# Patient Record
Sex: Male | Born: 1980 | Race: White | Hispanic: No | Marital: Single | Smoking: Never smoker
Health system: Southern US, Community
[De-identification: ages and names within clinical notes are randomized; demographics above are authoritative.]

## PROBLEM LIST (undated history)

## (undated) DIAGNOSIS — F70 Mild intellectual disabilities: Secondary | ICD-10-CM

## (undated) DIAGNOSIS — G800 Spastic quadriplegic cerebral palsy: Secondary | ICD-10-CM

## (undated) DIAGNOSIS — R519 Headache, unspecified: Secondary | ICD-10-CM

## (undated) DIAGNOSIS — R569 Unspecified convulsions: Secondary | ICD-10-CM

## (undated) DIAGNOSIS — I10 Essential (primary) hypertension: Secondary | ICD-10-CM

## (undated) DIAGNOSIS — Z9689 Presence of other specified functional implants: Secondary | ICD-10-CM

## (undated) HISTORY — DX: Spastic quadriplegic cerebral palsy: G80.0

## (undated) HISTORY — DX: Essential (primary) hypertension: I10

## (undated) HISTORY — DX: Mild intellectual disabilities: F70

## (undated) HISTORY — DX: Presence of other specified functional implants: Z96.89

## (undated) HISTORY — DX: Unspecified convulsions: R56.9

## (undated) HISTORY — PX: OTHER SURGICAL HISTORY: SHX169

---

## 2010-03-05 HISTORY — PX: WISDOM TOOTH EXTRACTION: SHX21

## 2011-09-09 DIAGNOSIS — G808 Other cerebral palsy: Secondary | ICD-10-CM | POA: Diagnosis not present

## 2011-10-19 DIAGNOSIS — I10 Essential (primary) hypertension: Secondary | ICD-10-CM | POA: Diagnosis not present

## 2011-10-19 DIAGNOSIS — G40909 Epilepsy, unspecified, not intractable, without status epilepticus: Secondary | ICD-10-CM | POA: Diagnosis not present

## 2011-11-22 DIAGNOSIS — G40309 Generalized idiopathic epilepsy and epileptic syndromes, not intractable, without status epilepticus: Secondary | ICD-10-CM | POA: Diagnosis not present

## 2011-11-22 DIAGNOSIS — G808 Other cerebral palsy: Secondary | ICD-10-CM | POA: Diagnosis not present

## 2011-11-22 DIAGNOSIS — G40209 Localization-related (focal) (partial) symptomatic epilepsy and epileptic syndromes with complex partial seizures, not intractable, without status epilepticus: Secondary | ICD-10-CM | POA: Diagnosis not present

## 2011-11-22 DIAGNOSIS — G248 Other dystonia: Secondary | ICD-10-CM | POA: Diagnosis not present

## 2011-12-09 DIAGNOSIS — G808 Other cerebral palsy: Secondary | ICD-10-CM | POA: Diagnosis not present

## 2012-04-25 DIAGNOSIS — G40909 Epilepsy, unspecified, not intractable, without status epilepticus: Secondary | ICD-10-CM | POA: Diagnosis not present

## 2012-04-25 DIAGNOSIS — I10 Essential (primary) hypertension: Secondary | ICD-10-CM | POA: Diagnosis not present

## 2012-05-04 DIAGNOSIS — G808 Other cerebral palsy: Secondary | ICD-10-CM | POA: Diagnosis not present

## 2012-07-17 DIAGNOSIS — G40209 Localization-related (focal) (partial) symptomatic epilepsy and epileptic syndromes with complex partial seizures, not intractable, without status epilepticus: Secondary | ICD-10-CM | POA: Diagnosis not present

## 2012-07-17 DIAGNOSIS — G248 Other dystonia: Secondary | ICD-10-CM | POA: Diagnosis not present

## 2012-07-17 DIAGNOSIS — G40309 Generalized idiopathic epilepsy and epileptic syndromes, not intractable, without status epilepticus: Secondary | ICD-10-CM | POA: Diagnosis not present

## 2012-08-17 DIAGNOSIS — G808 Other cerebral palsy: Secondary | ICD-10-CM | POA: Diagnosis not present

## 2012-11-16 DIAGNOSIS — G40909 Epilepsy, unspecified, not intractable, without status epilepticus: Secondary | ICD-10-CM | POA: Diagnosis not present

## 2012-11-16 DIAGNOSIS — G808 Other cerebral palsy: Secondary | ICD-10-CM | POA: Diagnosis not present

## 2012-11-16 DIAGNOSIS — I10 Essential (primary) hypertension: Secondary | ICD-10-CM | POA: Diagnosis not present

## 2013-01-29 ENCOUNTER — Other Ambulatory Visit: Payer: Self-pay

## 2013-01-29 MED ORDER — LAMOTRIGINE 100 MG PO TABS: 200.0000 mg | ORAL_TABLET | Freq: Three times a day (TID) | ORAL | Status: DC

## 2013-01-29 MED ORDER — LEVETIRACETAM 1000 MG PO TABS: 1000.0000 mg | ORAL_TABLET | Freq: Three times a day (TID) | ORAL | Status: DC

## 2013-01-29 MED ORDER — LACOSAMIDE 100 MG PO TABS: 100.0000 mg | ORAL_TABLET | Freq: Two times a day (BID) | ORAL | Status: DC

## 2013-04-01 DIAGNOSIS — I10 Essential (primary) hypertension: Secondary | ICD-10-CM | POA: Diagnosis not present

## 2013-04-01 DIAGNOSIS — G808 Other cerebral palsy: Secondary | ICD-10-CM | POA: Diagnosis not present

## 2013-04-01 DIAGNOSIS — G40909 Epilepsy, unspecified, not intractable, without status epilepticus: Secondary | ICD-10-CM | POA: Diagnosis not present

## 2013-04-18 DIAGNOSIS — G808 Other cerebral palsy: Secondary | ICD-10-CM | POA: Diagnosis not present

## 2013-06-07 ENCOUNTER — Telehealth: Payer: Self-pay | Admitting: Nurse Practitioner

## 2013-06-07 NOTE — Telephone Encounter (Signed)
We have not gotten any requests for this patient.  A 6 month Rx was sent to to the pharmacy on 01/29/13.  I called the pharmacy, [facility] has a Rx on file for Vimpat with refills which will be transferred to [facility]  They will contact patient when meds are ready for pick up.

## 2013-07-18 ENCOUNTER — Encounter (INDEPENDENT_AMBULATORY_CARE_PROVIDER_SITE_OTHER): Payer: Self-pay

## 2013-07-18 ENCOUNTER — Ambulatory Visit (INDEPENDENT_AMBULATORY_CARE_PROVIDER_SITE_OTHER): Payer: Medicare Other | Admitting: Nurse Practitioner

## 2013-07-18 ENCOUNTER — Encounter: Payer: Self-pay | Admitting: Nurse Practitioner

## 2013-07-18 VITALS — BP 119/67 | HR 65 | Ht 70.0 in | Wt 165.0 lb

## 2013-07-18 DIAGNOSIS — G248 Other dystonia: Secondary | ICD-10-CM | POA: Diagnosis not present

## 2013-07-18 DIAGNOSIS — G40309 Generalized idiopathic epilepsy and epileptic syndromes, not intractable, without status epilepticus: Secondary | ICD-10-CM

## 2013-07-18 DIAGNOSIS — G808 Other cerebral palsy: Secondary | ICD-10-CM | POA: Diagnosis not present

## 2013-07-18 DIAGNOSIS — G249 Dystonia, unspecified: Secondary | ICD-10-CM | POA: Insufficient documentation

## 2013-07-18 DIAGNOSIS — G40209 Localization-related (focal) (partial) symptomatic epilepsy and epileptic syndromes with complex partial seizures, not intractable, without status epilepticus: Secondary | ICD-10-CM | POA: Diagnosis not present

## 2013-07-18 MED ORDER — VIMPAT 150 MG PO TABS: 150.0000 mg | ORAL_TABLET | Freq: Two times a day (BID) | ORAL | Status: DC

## 2013-07-18 MED ORDER — LEVETIRACETAM 500 MG PO TABS: 1500.0000 mg | ORAL_TABLET | Freq: Two times a day (BID) | ORAL | Status: DC

## 2013-07-18 MED ORDER — LAMOTRIGINE 100 MG PO TABS: 200.0000 mg | ORAL_TABLET | Freq: Three times a day (TID) | ORAL | Status: DC

## 2013-07-18 NOTE — Patient Instructions (Signed)
Increase Vimpat to 150 mg BID Keppra 500 mg 3 tablets twice daily brand Lamictal 100 mg (2) 3 times a day brand Followup yearly and.

## 2013-07-18 NOTE — Progress Notes (Signed)
[facility]  PATIENT: Karla Vines DOB: [birth year]   REASON FOR VISIT: Followup for seizure disorder    HISTORY OF PRESENT ILLNESS:Mr. Gastelum 32 year old male  with Cerebral Palsy, left dominant hemi-paresis, spasticity - of Dr Thad Ranger (whom he sees In January and April, every 3 months for Botox injection.),  He achieved better seizure control on a higher Keppra dose of 1000 mg tid po,  Vimpat at 100 mg bid and Lamictal at 100 mg 2 tabs tid po. ALL BRAND NAME. He sleeps well at night 23:00 to 8 AM, no naps in daytime. He does goes for weeks  with no seizures and then will have several seizures back-to-back which are brief, but the mother states overall his seizures are better.   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 Seizures began in 1995. He is accompanied today by his mother. She tells me that his seizures are so much better since being placed on additional Keppra; he may have one seizure several days in a week and then go weeks without any. Seizures continue to be brief. No change in the character of his seizures, he will occasionally have a rare headache afterwards.   He is basically wheelchair-bound. [place]. He gets around in a golf cart. He is able to utilize a walker in the home since receiving Botox to the left hand. Prior to that, he mostly crawls on the floor due to spastic dystonia of the left wrist. He has received several Botox injections to the left hand and is now able to hold his walker with both hands and drive his [place] cart [place]. The mother and pt are pleased with the response. There have been no visual problems, no behavioral problems. Appetite is good, he is sleeping well. He has been receiving Botox in [place] with Dr. Thad Ranger.      REVIEW OF SYSTEMS: Full 14 system review of systems performed and notable  only for:  Constitutional: N/A  Cardiovascular: N/A  Ear/Nose/Throat: N/A  Skin: N/A  Eyes: N/A  Respiratory: N/A  Gastroitestinal: N/A  Hematology/Lymphatic: N/A  Endocrine: N/A Musculoskeletal:N/A  Allergy/Immunology: N/A  Neurological: N/A Psychiatric: N/A   ALLERGIES: Not on File  HOME MEDICATIONS: Outpatient Prescriptions Prior to Visit  Medication Sig Dispense Refill  . Lacosamide (VIMPAT) 100 MG TABS Take 1 tablet (100 mg total) by mouth 2 (two) times daily. Brand Medically Necessary  180 tablet  1  . lamoTRIgine (LAMICTAL) 100 MG tablet Take 2 tablets (200 mg total) by mouth 3 (three) times daily. Brand Medically Necessary  540 tablet  1  . levETIRAcetam (KEPPRA) 1000 MG tablet Take 1 tablet (1,000 mg total) by mouth 3 (three) times daily. Brand Medically Necessary  180 tablet  1   No facility-administered medications prior to visit.    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 No pertinent past surgical history.  FAMILY HISTORY: Heart disease father.  SOCIAL HISTORY: History   Social History  . Marital Status: Single    Spouse Name: N/A    Number of Children: N/A  . Years of Education: N/A   Occupational History  . Not on file.   Social History Main Topics  . Smoking status: Never Smoker   . Smokeless tobacco: Never Used  . Alcohol Use: Not  on file  . Drug Use: No  . Sexual Activity: Not on file   Other Topics Concern  . Not on file   Social History Narrative  . No narrative on Lives with parents disabled     PHYSICAL EXAM  Filed Vitals:   07/18/13 1414  BP: 119/67  Pulse: 65  Height: 5\' 10"  (1.778 m)  Weight: 165 lb (74.844 kg)   Body mass index is 23.68 kg/(m^2).  Generalized: Well developed, in no acute distress  Head: normocephalic and atraumatic,. Oropharynx benign  Neck: Supple, no carotid bruits  Cardiac: Regular rate rhythm, no  murmur  Musculoskeletal: No deformity   Neurological examination   Mentation: Alert oriented to time, place, Mild MR. Follows all commands speech dysarthric  Cranial nerve II-XII: Pupils were equal round reactive to light , disconjugate eye movements noted, visual fields are full  Facial sensation and strength were normal. hearing was intact to finger rubbing bilaterally. Uvula tongue midline. head turning and shoulder shrug and were normal and symmetric.Tongue protrusion into cheek strength was normal. Decrease shoulder shrug on the left Motor: Tone increased in all 3 extremities with sparing of the right upper extremity. Good grip strength on the right opens  fingers of the left hand but  spastic dystonia noted with clawlike deformity. Good strength in the right upper extremity and mild weakness both lower extremities Sensory: Withdraws to pain Coordination: Impaired on the left  Reflexes: Left reflexes are brisker than right no clonus . Gait and Station: Not ambulated    DIAGNOSTIC DATA (LABS, IMAGING, TESTING) -None to review   ASSESSMENT AND PLAN  32 y.o. 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 RX given to mother per request Nilda Riggs, [facility], [facility], APRN  [facility] [address] [place], [place] [address] [phone]

## 2013-07-19 DIAGNOSIS — G808 Other cerebral palsy: Secondary | ICD-10-CM | POA: Diagnosis not present

## 2013-11-12 DIAGNOSIS — G808 Other cerebral palsy: Secondary | ICD-10-CM | POA: Diagnosis not present

## 2013-11-12 DIAGNOSIS — G40909 Epilepsy, unspecified, not intractable, without status epilepticus: Secondary | ICD-10-CM | POA: Diagnosis not present

## 2013-11-12 DIAGNOSIS — I10 Essential (primary) hypertension: Secondary | ICD-10-CM | POA: Diagnosis not present

## 2013-11-22 DIAGNOSIS — G808 Other cerebral palsy: Secondary | ICD-10-CM | POA: Diagnosis not present

## 2013-12-17 ENCOUNTER — Other Ambulatory Visit: Payer: Self-pay

## 2013-12-17 MED ORDER — VIMPAT 150 MG PO TABS: 150.0000 mg | ORAL_TABLET | Freq: Two times a day (BID) | ORAL | Status: DC

## 2013-12-17 NOTE — Telephone Encounter (Signed)
Rx signed and faxed.

## 2014-02-28 DIAGNOSIS — G808 Other cerebral palsy: Secondary | ICD-10-CM | POA: Diagnosis not present

## 2014-05-30 DIAGNOSIS — G808 Other cerebral palsy: Secondary | ICD-10-CM | POA: Diagnosis not present

## 2014-06-12 DIAGNOSIS — I10 Essential (primary) hypertension: Secondary | ICD-10-CM | POA: Diagnosis not present

## 2014-06-12 DIAGNOSIS — G801 Spastic diplegic cerebral palsy: Secondary | ICD-10-CM | POA: Diagnosis not present

## 2014-06-18 ENCOUNTER — Telehealth: Payer: Self-pay | Admitting: Neurology
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
## 2014-06-18 NOTE — Telephone Encounter (Signed)
Patient's mother is calling. She states patient has been having more seizures and needs to be seen earlier than appointment he has scheduled on 07-17-14. Please call.

## 2014-06-19 NOTE — Telephone Encounter (Signed)
Spoke with patient mother in regards to sooner appointment. Explained that Dr. Brett Fairy does not have anything as of today. We will put patient on waitlist and call if office visit becomes available.

## 2014-06-24 NOTE — Telephone Encounter (Signed)
May I see this patient on Thursday as last patiet of the day ?

## 2014-06-27 NOTE — Telephone Encounter (Signed)
I called and LMVM for mother that appt available for 07-01-14 at 1430.  Please return call.

## 2014-06-27 NOTE — Telephone Encounter (Signed)
Appt made and confirmed by mother.

## 2014-07-01 ENCOUNTER — Ambulatory Visit (INDEPENDENT_AMBULATORY_CARE_PROVIDER_SITE_OTHER): Payer: Medicare Other | Admitting: Neurology

## 2014-07-01 ENCOUNTER — Encounter: Payer: Self-pay | Admitting: Neurology

## 2014-07-01 VITALS — BP 110/73 | HR 62 | Temp 98.7°F | Resp 14 | Ht 70.0 in | Wt 168.0 lb

## 2014-07-01 DIAGNOSIS — G40209 Localization-related (focal) (partial) symptomatic epilepsy and epileptic syndromes with complex partial seizures, not intractable, without status epilepticus: Secondary | ICD-10-CM

## 2014-07-01 MED ORDER — LAMOTRIGINE 100 MG PO TABS: 200.0000 mg | ORAL_TABLET | Freq: Three times a day (TID) | ORAL | Status: DC

## 2014-07-01 MED ORDER — VIMPAT 150 MG PO TABS: 150.0000 mg | ORAL_TABLET | Freq: Two times a day (BID) | ORAL | Status: DC

## 2014-07-01 MED ORDER — LEVETIRACETAM 500 MG PO TABS: 2000.0000 mg | ORAL_TABLET | Freq: Two times a day (BID) | ORAL | Status: DC

## 2014-07-01 NOTE — Patient Instructions (Signed)
I increased the Keppra to 200 mg bid po. If Harry Carr becomes increasingly irritable or depressed, please inform me and we  will reduce the dose back to 1500 mg bid.  Rv in 4-5 weeks with [name], NP .
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
## 2014-07-16 ENCOUNTER — Ambulatory Visit: Payer: Medicare Other | Admitting: Neurology

## 2014-07-17 ENCOUNTER — Ambulatory Visit: Payer: Medicare Other | Admitting: Neurology

## 2014-09-12 DIAGNOSIS — G808 Other cerebral palsy: Secondary | ICD-10-CM | POA: Diagnosis not present

## 2014-09-15 ENCOUNTER — Ambulatory Visit: Payer: Medicare Other | Admitting: Nurse Practitioner

## 2014-12-12 DIAGNOSIS — G808 Other cerebral palsy: Secondary | ICD-10-CM | POA: Diagnosis not present

## 2014-12-29 DIAGNOSIS — G40909 Epilepsy, unspecified, not intractable, without status epilepticus: Secondary | ICD-10-CM | POA: Diagnosis not present

## 2014-12-29 DIAGNOSIS — G801 Spastic diplegic cerebral palsy: Secondary | ICD-10-CM | POA: Diagnosis not present

## 2014-12-29 DIAGNOSIS — I10 Essential (primary) hypertension: Secondary | ICD-10-CM | POA: Diagnosis not present

## 2014-12-29 DIAGNOSIS — Z6830 Body mass index (BMI) 30.0-30.9, adult: Secondary | ICD-10-CM | POA: Diagnosis not present

## 2014-12-31 ENCOUNTER — Telehealth: Payer: Self-pay

## 2014-12-31 ENCOUNTER — Other Ambulatory Visit: Payer: Self-pay

## 2014-12-31 MED ORDER — VIMPAT 150 MG PO TABS: 150.0000 mg | ORAL_TABLET | Freq: Two times a day (BID) | ORAL | Status: DC

## 2014-12-31 NOTE — Telephone Encounter (Signed)
Will Fax to [place].

## 2014-12-31 NOTE — Telephone Encounter (Signed)
Called pt to alert him that rx is ready to be picked up, no answer, did not leave a message. Will try again later.

## 2014-12-31 NOTE — Telephone Encounter (Signed)
Rx signed and faxed.

## 2015-02-19 ENCOUNTER — Telehealth: Payer: Self-pay

## 2015-02-19 ENCOUNTER — Encounter: Payer: Self-pay | Admitting: Nurse Practitioner

## 2015-02-19 ENCOUNTER — Ambulatory Visit (INDEPENDENT_AMBULATORY_CARE_PROVIDER_SITE_OTHER): Payer: Medicare Other | Admitting: Nurse Practitioner

## 2015-02-19 VITALS — BP 120/77 | HR 62

## 2015-02-19 DIAGNOSIS — G249 Dystonia, unspecified: Secondary | ICD-10-CM | POA: Diagnosis not present

## 2015-02-19 DIAGNOSIS — G808 Other cerebral palsy: Secondary | ICD-10-CM | POA: Diagnosis not present

## 2015-02-19 DIAGNOSIS — Z5181 Encounter for therapeutic drug level monitoring: Secondary | ICD-10-CM

## 2015-02-19 DIAGNOSIS — G40209 Localization-related (focal) (partial) symptomatic epilepsy and epileptic syndromes with complex partial seizures, not intractable, without status epilepticus: Secondary | ICD-10-CM | POA: Diagnosis not present

## 2015-02-19 DIAGNOSIS — G40309 Generalized idiopathic epilepsy and epileptic syndromes, not intractable, without status epilepticus: Secondary | ICD-10-CM | POA: Diagnosis not present

## 2015-02-19 NOTE — Progress Notes (Signed)
I agree with the assessment and plan as directed by NP .The patient is known to me .   Hassie Mandt, MD  
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
## 2015-02-19 NOTE — Patient Instructions (Signed)
Will check level of Lamictal and Keppra today Continue Lamictal and Keppra and Vimpat at current doses May think about switching to ER preparations if coverage can be maintained Follow-up in 6-8 months

## 2015-02-19 NOTE — Telephone Encounter (Signed)
Patient is currently taking Vimpat, Keppra and Lamictal.  Vimpat and Keppra offer an assistance program to those who qualify [phone]) and Lamictal has and alternate program through [place] for patient's with Medicare Part D [phone]).  I called to provide contact info for the assistance programs.  Harry Carr was not able to take the info, asked that I called back again and leave info on voicemail.  I called back and left message with this info.  Asked that she call us back if she has any questions or if anything further is needed.

## 2015-03-20 DIAGNOSIS — G808 Other cerebral palsy: Secondary | ICD-10-CM | POA: Diagnosis not present

## 2015-04-07 NOTE — Progress Notes (Signed)
I agree with the assessment and plan as directed by NP .The patient is known to me .  Has more seizures recently  on high doses of generic Keppra and Lamictal.  Levels to be followed , consider brand name if seizure frequency persists in a compliant patient. If compliant, consider VNS.     Elajah Kunsman, MD

## 2015-06-26 DIAGNOSIS — G808 Other cerebral palsy: Secondary | ICD-10-CM | POA: Diagnosis not present

## 2015-07-07 DIAGNOSIS — G40909 Epilepsy, unspecified, not intractable, without status epilepticus: Secondary | ICD-10-CM | POA: Diagnosis not present

## 2015-07-07 DIAGNOSIS — G801 Spastic diplegic cerebral palsy: Secondary | ICD-10-CM | POA: Diagnosis not present

## 2015-07-07 DIAGNOSIS — I10 Essential (primary) hypertension: Secondary | ICD-10-CM | POA: Diagnosis not present

## 2015-07-23 ENCOUNTER — Telehealth: Payer: Self-pay | Admitting: Neurology

## 2015-07-23 ENCOUNTER — Other Ambulatory Visit: Payer: Self-pay

## 2015-07-23 MED ORDER — LEVETIRACETAM 500 MG PO TABS: ORAL_TABLET | ORAL | Status: DC

## 2015-07-23 MED ORDER — LAMOTRIGINE 100 MG PO TABS: 200.0000 mg | ORAL_TABLET | Freq: Three times a day (TID) | ORAL | Status: DC

## 2015-07-23 MED ORDER — VIMPAT 150 MG PO TABS: 150.0000 mg | ORAL_TABLET | Freq: Two times a day (BID) | ORAL | Status: DC

## 2015-07-23 NOTE — Telephone Encounter (Signed)
I called [facility] we have on file.  Spoke with Camila Li.  He said [name] is not a patient there and recommended we contact the other [facility]  I called them, Spoke with Sam.  He verified the patient gets meds there.  They actually just sent [place] the refill requests a few minutes ago, which is why we did not have them earlier.  Pharmacy has been corrected in chart, and Rx's were sent.

## 2015-07-23 NOTE — Telephone Encounter (Signed)
Patient's mother is calling to get a refill called in for VIMPAT 150 MG TABS for the patient. Please call to [facility]. The patient is out of medication and is still having seizures and the patient's mother is very upset. The patient's mother says the pharmacy sent a request. Thank you.

## 2015-07-23 NOTE — Telephone Encounter (Signed)
I spoke to [place], Software engineer at [facility].  She is calling to get verbal order for Vimpat.  I gave the order since I had her on the phone.   Written prescription had not been signed as yet.   Vimpat 150mg  po bid # 60 with 5 refills.  Written order not needed.  She also asking for refill on other seizure medications.   Has appt in 09/2015.

## 2015-07-23 NOTE — Telephone Encounter (Signed)
We, unfortunately, have not received a request from the pharmacy as of this time.  Refill request has been entered, and forwarded to provider for signature, as Vimpat is a controlled substance.

## 2015-07-27 ENCOUNTER — Telehealth: Payer: Self-pay | Admitting: Neurology

## 2015-07-27 MED ORDER — LAMOTRIGINE 100 MG PO TABS: 200.0000 mg | ORAL_TABLET | Freq: Three times a day (TID) | ORAL | Status: DC

## 2015-07-27 MED ORDER — LEVETIRACETAM 500 MG PO TABS: ORAL_TABLET | ORAL | Status: DC

## 2015-07-27 NOTE — Telephone Encounter (Signed)
I called the pharmacy back again.  Spoke with [place].  Relayed providers note.  She expressed understanding.

## 2015-07-27 NOTE — Telephone Encounter (Signed)
I called back and spoke with [facility].  She said although we have been prescribing Lamictal and Keppra Brand Name Only (DAW), they have been dispensing generic since Feb.  They would like to continue dispensing generic if permissible.

## 2015-07-27 NOTE — Telephone Encounter (Signed)
[facility] called and states they have filled rx lamoTRIgine (LAMICTAL) 100 MG tablet as the generic for the pt for a while now. They say the current rx is asking to use the brand name. They would like to know if it is ok to use the generic form. Please call [phone].

## 2015-07-27 NOTE — Telephone Encounter (Signed)
Ok as long as patient not having problems.

## 2015-08-03 NOTE — Telephone Encounter (Addendum)
Pt's mother called sts levETIRAcetam (KEPPRA) 500 MG tablet she is doesn't know  #of tabs pt is to take daily, seizures have gotten worse, generic keppra levels were 54 or 56 at Dr [facility] office and should be 14.He was taking 8 tabs/day per Dr Brett Fairy. But RX written 07/27/15/ is written for 7 tabs /day but does not know if it is gen or brand. She sts generic does not work. Please call and advise at [phone]

## 2015-08-03 NOTE — Telephone Encounter (Signed)
I called and spoke to mother of pt.  When pt last seen by Dr. Brett Fairy, she was told she could give pt 7-8 500mg  generic keppra tabs daily.  Saw CM/ NP in 02-19-15 pt taking 7 tablets at that time.  In the last 4 months, pt taking 8 tablets due to pt continuing having seizures. No change in seizures.  She received letter from insurance stating approval for BM meds for lamictal and keppra for 1 yr (07-2016).  Marland Kitchen  Has been on generic since February 2016 per pharmacy.  Dr. Henrene Pastor, pcp has done lab work and last keppra trough level was 54-56.  I will call and get results.  She seemed to think the seizures was causing more of a problem since pt c/o his head hurting a lot more when has seizure.  Has appt 09/2015.  Would forward message to CM/NP.   Can make appt to discuss.  Mom was good to do whatever.

## 2015-08-05 ENCOUNTER — Ambulatory Visit (INDEPENDENT_AMBULATORY_CARE_PROVIDER_SITE_OTHER): Payer: Medicare Other | Admitting: Nurse Practitioner

## 2015-08-05 ENCOUNTER — Telehealth: Payer: Self-pay

## 2015-08-05 ENCOUNTER — Encounter: Payer: Self-pay | Admitting: Nurse Practitioner

## 2015-08-05 VITALS — BP 134/77 | HR 79 | Ht 70.0 in | Wt 165.0 lb

## 2015-08-05 DIAGNOSIS — G249 Dystonia, unspecified: Secondary | ICD-10-CM

## 2015-08-05 DIAGNOSIS — G40309 Generalized idiopathic epilepsy and epileptic syndromes, not intractable, without status epilepticus: Secondary | ICD-10-CM

## 2015-08-05 DIAGNOSIS — G40209 Localization-related (focal) (partial) symptomatic epilepsy and epileptic syndromes with complex partial seizures, not intractable, without status epilepticus: Secondary | ICD-10-CM | POA: Diagnosis not present

## 2015-08-05 DIAGNOSIS — G808 Other cerebral palsy: Secondary | ICD-10-CM | POA: Diagnosis not present

## 2015-08-05 MED ORDER — LAMOTRIGINE 100 MG PO TABS: 200.0000 mg | ORAL_TABLET | Freq: Three times a day (TID) | ORAL | Status: DC

## 2015-08-05 MED ORDER — VIMPAT 150 MG PO TABS: 150.0000 mg | ORAL_TABLET | Freq: Two times a day (BID) | ORAL | Status: DC

## 2015-08-05 MED ORDER — LEVETIRACETAM 500 MG PO TABS: 1500.0000 mg | ORAL_TABLET | Freq: Two times a day (BID) | ORAL | Status: DC

## 2015-08-05 NOTE — Telephone Encounter (Signed)
I called [facility] at [phone].  They were closed for the day.  Left message asking that they cancel Rx's.   Prescriptions have been resent to [facility], as patient prefers to use this pharmacy instead.  Receipt confirmed by pharmacy.

## 2015-08-05 NOTE — Patient Instructions (Addendum)
Brand drugs are approved through November 2017 Will renew Lamictal, Keppra and Vimpat Will follow up in 6 months next with Dr. Brett Fairy
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
## 2015-08-06 NOTE — Progress Notes (Signed)
I agree with the assessment and plan as directed by NP .The patient is known to me .   Finnlee Guarnieri, MD  

## 2015-09-21 ENCOUNTER — Ambulatory Visit: Payer: Medicare Other | Admitting: Nurse Practitioner

## 2015-10-09 DIAGNOSIS — G808 Other cerebral palsy: Secondary | ICD-10-CM | POA: Diagnosis not present

## 2015-12-31 DIAGNOSIS — G40909 Epilepsy, unspecified, not intractable, without status epilepticus: Secondary | ICD-10-CM | POA: Diagnosis not present

## 2015-12-31 DIAGNOSIS — I10 Essential (primary) hypertension: Secondary | ICD-10-CM | POA: Diagnosis not present

## 2015-12-31 DIAGNOSIS — G801 Spastic diplegic cerebral palsy: Secondary | ICD-10-CM | POA: Diagnosis not present

## 2016-01-13 ENCOUNTER — Telehealth: Payer: Self-pay | Admitting: *Deleted

## 2016-01-13 NOTE — Telephone Encounter (Signed)
I called and spoke to [place] at [employer].   Will refill when pt in for appt on 02-02-16 with Dr.  Brett Fairy.  (refills on Lamictal, keppra and vimpat) all Brand names.

## 2016-01-15 DIAGNOSIS — G808 Other cerebral palsy: Secondary | ICD-10-CM | POA: Diagnosis not present

## 2016-02-02 ENCOUNTER — Encounter: Payer: Self-pay | Admitting: Neurology

## 2016-02-02 ENCOUNTER — Ambulatory Visit (INDEPENDENT_AMBULATORY_CARE_PROVIDER_SITE_OTHER): Payer: Medicare Other | Admitting: Neurology

## 2016-02-02 VITALS — BP 100/60 | HR 78 | Resp 20

## 2016-02-02 DIAGNOSIS — G40209 Localization-related (focal) (partial) symptomatic epilepsy and epileptic syndromes with complex partial seizures, not intractable, without status epilepticus: Secondary | ICD-10-CM | POA: Diagnosis not present

## 2016-02-02 MED ORDER — VIMPAT 150 MG PO TABS: 150.0000 mg | ORAL_TABLET | Freq: Two times a day (BID) | ORAL | Status: DC

## 2016-02-02 MED ORDER — LEVETIRACETAM 500 MG PO TABS: 1500.0000 mg | ORAL_TABLET | Freq: Two times a day (BID) | ORAL | Status: DC

## 2016-02-02 MED ORDER — LAMOTRIGINE 100 MG PO TABS: 200.0000 mg | ORAL_TABLET | Freq: Three times a day (TID) | ORAL | Status: DC

## 2016-02-02 NOTE — Patient Instructions (Signed)
Lamotrigine extended-release tablets What is this medicine? LAMOTRIGINE (la MOE Hendricks Limes) is used to control seizures in adults and children with epilepsy. This medicine may be used for other purposes; ask your health care provider or pharmacist if you have questions. What should I tell my health care provider before I take this medicine? They need to know if you have any of these conditions: -a history of depression or bipolar disorder -aseptic meningitis during prior use of lamotrigine -folate deficiency -kidney disease -liver disease -suicidal thoughts, plans, or attempt; a previous suicide attempt by you or a family member -an unusual or allergic reaction to lamotrigine or other seizure medicines, other medicines, foods, dyes, or preservatives -pregnant or trying to get pregnant -breast-feeding How should I use this medicine? Take this medicine by mouth with a glass of water. Follow the directions on the prescription label. Swallow these tablets whole. Do not chew, crush, or divide them. If this medicine upsets your stomach, take it with food or milk. Take your doses at regular intervals. Do not take your medicine more often than directed. A special MedGuide will be given to you by the pharmacist with each new prescription and refill. Be sure to read this information carefully each time. Talk to your pediatrician regarding the use of this medicine in children. While this drug may be prescribed for children as young as 2 years of age, precautions do apply. Overdosage: If you think you have taken too much of this medicine contact a poison control center or emergency room at once. NOTE: This medicine is only for you. Do not share this medicine with others. What if I miss a dose? If you miss a dose, take it as soon as you can. If it is almost time for your next dose, take only that dose. Do not take double or extra doses. What may interact with this medicine? -carbamazepine -male  hormones, including contraceptive or birth control pills -methotrexate -phenobarbital -phenytoin -primidone -pyrimethamine -rifampin -trimethoprim -valproic acid This list may not describe all possible interactions. Give your health care provider a list of all the medicines, herbs, non-prescription drugs, or dietary supplements you use. Also tell them if you smoke, drink alcohol, or use illegal drugs. Some items may interact with your medicine. What should I watch for while using this medicine? Visit your doctor or health care professional for regular checks on your progress. Wear a Probation officer or necklace. Carry an identification card with information about your condition, medicines, and doctor or health care professional. It is important to take this medicine exactly as directed. When first starting treatment, your dose will need to be adjusted slowly. It may take weeks or months before your dose is stable. You should contact your doctor or health care professional if your seizures get worse or if you have any new types of seizures. Do not stop taking this medicine unless instructed by your doctor or health care professional. Stopping your medicine suddenly can increase your seizures or their severity. Contact your doctor or health care professional right away if you develop a rash while taking this medicine. Rashes may be very severe and sometimes require treatment in the hospital. Deaths from rashes have occurred. Serious rashes occur more often in children than adults taking this medicine. It is more common for these serious rashes to occur during the first 2 months of treatment, but a rash can occur at any time. You may get drowsy, dizzy, or have blurred vision. Do not drive, use machinery,  or do anything that needs mental alertness until you know how this medicine affects you. To reduce dizzy or fainting spells, do not sit or stand up quickly, especially if you are an older patient.  Alcohol can increase drowsiness and dizziness. Avoid alcoholic drinks. The use of this medicine may increase the chance of suicidal thoughts or actions. Pay special attention to how you are responding while on this medicine. Any worsening of mood, or thoughts of suicide or dying should be reported to your health care professional right away. Your mouth may get dry. Chewing sugarless gum or sucking hard candy, and drinking plenty of water may help. Contact your doctor if the problem does not go away or is severe. Women who become pregnant while using this medicine may enroll in the [place] Pregnancy Registry by calling [id number]. This registry collects information about the safety of antiepileptic drug use during pregnancy. What side effects may I notice from receiving this medicine? Side effects you should report to your doctor or health care professional as soon as possible: -allergic reactions like skin rash, itching or hives, swelling of the face, lips, or tongue -blurred or double vision -difficulty walking or controlling muscle movements -fever -headache, stiff neck, and sensitivity to light -painful sores in the mouth, eyes, or nose -redness, blistering, peeling or loosening of the skin, including inside the mouth -severe muscle pain -swollen lymph glands -uncontrollable eye movements -unusual bruising or bleeding -unusually weak or tired -vomiting -worsening of mood, thoughts or actions of suicide or dying -yellowing of the eyes or skin Side effects that usually do not require medical attention (report to your doctor or health care professional if they continue or are bothersome): -diarrhea, or constipation -difficulty sleeping -nausea -tremors This list may not describe all possible side effects. Call your doctor for medical advice about side effects. You may report side effects to FDA at 1-800-FDA-1088. Where should I keep my medicine? Keep out of  reach of children. Store at room temperature between 15 and 30 degrees C (59 and 86 degrees F). Throw away any unused medicine after the expiration date. NOTE: This sheet is a summary. It may not cover all possible information. If you have questions about this medicine, talk to your doctor, pharmacist, or health care provider.    2016, Elsevier/Gold Standard. (2010-08-25 12:18:31) Epilepsy Epilepsy is a disorder in which a person has repeated seizures over time. A seizure is a release of abnormal electrical activity in the brain. Seizures can cause a change in attention, behavior, or the ability to remain awake and alert (altered mental status). Seizures often involve uncontrollable shaking (convulsions).  Most people with epilepsy lead normal lives. However, people with epilepsy are at an increased risk of falls, accidents, and injuries. Therefore, it is important to begin treatment right away. CAUSES  Epilepsy has many possible causes. Anything that disturbs the normal pattern of brain cell activity can lead to seizures. This may include:   Head injury.  Birth trauma.  High fever as a child.  Stroke.  Bleeding into or around the brain.  Certain drugs.  Prolonged low oxygen, such as what occurs after CPR efforts.  Abnormal brain development.  Certain illnesses, such as meningitis, encephalitis (brain infection), malaria, and other infections.  An imbalance of nerve signaling chemicals (neurotransmitters).  SIGNS AND SYMPTOMS  The symptoms of a seizure can vary greatly from one person to another. Right before a seizure, you may have a warning (aura) that a seizure is  about to occur. An aura may include the following symptoms:  Fear or anxiety.  Nausea.  Feeling like the room is spinning (vertigo).  Vision changes, such as seeing flashing lights or spots. Common symptoms during a seizure include:  Abnormal sensations, such as an abnormal smell or a bitter taste in the  mouth.   Sudden, general body stiffness.   Convulsions that involve rhythmic jerking of the face, arm, or leg on one or both sides.   Sudden change in consciousness.   Appearing to be awake but not responding.   Appearing to be asleep but cannot be awakened.   Grimacing, chewing, lip smacking, drooling, tongue biting, or loss of bowel or bladder control. After a seizure, you may feel sleepy for a while. DIAGNOSIS  Your health care provider will ask about your symptoms and take a medical history. Descriptions from any witnesses to your seizures will be very helpful in the diagnosis. A physical exam, including a detailed neurological exam, is necessary. Various tests may be done, such as:   An electroencephalogram (EEG). This is a painless test of your brain waves. In this test, a diagram is created of your brain waves. These diagrams can be interpreted by a specialist.  An MRI of the brain.   A CT scan of the brain.   A spinal tap (lumbar puncture, LP).  Blood tests to check for signs of infection or abnormal blood chemistry. TREATMENT  There is no cure for epilepsy, but it is generally treatable. Once epilepsy is diagnosed, it is important to begin treatment as soon as possible. For most people with epilepsy, seizures can be controlled with medicines. The following may also be used:  A pacemaker for the brain (vagus nerve stimulator) can be used for people with seizures that are not well controlled by medicine.  Surgery on the brain. For some people, epilepsy eventually goes away. HOME CARE INSTRUCTIONS   Follow your health care provider's recommendations on driving and safety in normal activities.  Get enough rest. Lack of sleep can cause seizures.  Only take over-the-counter or prescription medicines as directed by your health care provider. Take any prescribed medicine exactly as directed.  Avoid any known triggers of your seizures.  Keep a seizure diary. Record  what you recall about any seizure, especially any possible trigger.   Make sure the people you live and work with know that you are prone to seizures. They should receive instructions on how to help you. In general, a witness to a seizure should:   Cushion your head and body.   Turn you on your side.   Avoid unnecessarily restraining you.   Not place anything inside your mouth.   Call for emergency medical help if there is any question about what has occurred.   Follow up with your health care provider as directed. You may need regular blood tests to monitor the levels of your medicine.  SEEK MEDICAL CARE IF:   You develop signs of infection or other illness. This might increase the risk of a seizure.   You seem to be having more frequent seizures.   Your seizure pattern is changing.  SEEK IMMEDIATE MEDICAL CARE IF:   You have a seizure that does not stop after a few moments.   You have a seizure that causes any difficulty in breathing.   You have a seizure that results in a very severe headache.   You have a seizure that leaves you with the inability to  speak or use a part of your body.    This information is not intended to replace advice given to you by your health care provider. Make sure you discuss any questions you have with your health care provider.   Document Released: 08/22/2005 Document Revised: 06/12/2013 Document Reviewed: 04/03/2013 Elsevier Interactive Patient Education [employer].
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
## 2016-02-03 ENCOUNTER — Telehealth: Payer: Self-pay

## 2016-02-03 NOTE — Telephone Encounter (Signed)
I called pt to discuss results. No answer, left a message asking him to call me back.

## 2016-02-03 NOTE — Telephone Encounter (Signed)
-----  Message from Larey Seat, MD sent at 02/03/2016  5:15 PM EDT ----- Elevated sodium and alk phosphatase. Not a concern for lamictal dosing. Will follow with RV . CD

## 2016-02-04 LAB — COMPREHENSIVE METABOLIC PANEL
A/G RATIO: 1.7 (ref 1.2–2.2)
ALBUMIN: 4.7 g/dL (ref 3.5–5.5)
ALK PHOS: 120 IU/L — AB (ref 39–117)
ALT: 11 IU/L (ref 0–44)
AST: 25 IU/L (ref 0–40)
BILIRUBIN TOTAL: 0.8 mg/dL (ref 0.0–1.2)
BUN / CREAT RATIO: 8 — AB (ref 9–20)
BUN: 6 mg/dL (ref 6–20)
CHLORIDE: 100 mmol/L (ref 96–106)
CO2: 26 mmol/L (ref 18–29)
Calcium: 9.9 mg/dL (ref 8.7–10.2)
Creatinine, Ser: 0.79 mg/dL (ref 0.76–1.27)
GFR calc Af Amer: 136 mL/min/{1.73_m2} (ref >59)
GFR calc non Af Amer: 118 mL/min/{1.73_m2} (ref >59)
GLOBULIN, TOTAL: 2.7 g/dL (ref 1.5–4.5)
Glucose: 78 mg/dL (ref 65–99)
POTASSIUM: 4.1 mmol/L (ref 3.5–5.2)
SODIUM: 146 mmol/L — AB (ref 134–144)
Total Protein: 7.4 g/dL (ref 6.0–8.5)

## 2016-02-04 LAB — LAMOTRIGINE LEVEL: LAMOTRIGINE LVL: 10 ug/mL (ref 2.0–20.0)

## 2016-02-04 NOTE — Telephone Encounter (Signed)
Message For: [facility]                  Taken  1-JUN-17 at  2:27PM by [facility] ------------------------------------------------------------ Harry Carr                CID [id number]  [name]        Pt's [facility]      Area Code 336 Phone# [phone]    [place]; PLEASE         C/B                                                  Disp:Y/N N If Y = C/B If No Response In 74minutes ============================================================

## 2016-02-04 NOTE — Telephone Encounter (Signed)
I spoke to pt's mother. I advised her that Dr. Brett Fairy reviewed pt's labs and reports that pt's sodium and alk phos were elevated, but this is not a concern for his lamictal dosing.  Pt's mother wants to know how to correct the elevated sodium and alk phos, and wants Dr. Edwena Felty recommendations. Should he take additional vitamin d or change his diet?

## 2016-02-04 NOTE — Telephone Encounter (Signed)
Spoke to mother. There is no need. These are metabolic aberrations form the norm, Variations. He can use 2000 Vit D units daily for bone health. This is not needed but optional. It varies even with level of hydration. CD

## 2016-04-22 DIAGNOSIS — G808 Other cerebral palsy: Secondary | ICD-10-CM | POA: Diagnosis not present

## 2016-07-04 DIAGNOSIS — I10 Essential (primary) hypertension: Secondary | ICD-10-CM | POA: Diagnosis not present

## 2016-07-04 DIAGNOSIS — G40909 Epilepsy, unspecified, not intractable, without status epilepticus: Secondary | ICD-10-CM | POA: Diagnosis not present

## 2016-07-04 DIAGNOSIS — G801 Spastic diplegic cerebral palsy: Secondary | ICD-10-CM | POA: Diagnosis not present

## 2016-07-11 ENCOUNTER — Other Ambulatory Visit: Payer: Self-pay

## 2016-07-11 MED ORDER — VIMPAT 150 MG PO TABS: 150.0000 mg | ORAL_TABLET | Freq: Two times a day (BID) | ORAL | 5 refills | Status: DC

## 2016-07-12 NOTE — Telephone Encounter (Signed)
RX for vimpat faxed to [facility]. Received a receipt of confirmation.  

## 2016-07-22 DIAGNOSIS — G808 Other cerebral palsy: Secondary | ICD-10-CM | POA: Diagnosis not present

## 2016-08-04 ENCOUNTER — Ambulatory Visit: Payer: Medicare Other | Admitting: Nurse Practitioner

## 2016-08-15 ENCOUNTER — Telehealth: Payer: Self-pay

## 2016-08-15 DIAGNOSIS — G40209 Localization-related (focal) (partial) symptomatic epilepsy and epileptic syndromes with complex partial seizures, not intractable, without status epilepticus: Secondary | ICD-10-CM

## 2016-08-15 MED ORDER — LEVETIRACETAM 500 MG PO TABS: 1500.0000 mg | ORAL_TABLET | Freq: Two times a day (BID) | ORAL | 0 refills | Status: DC

## 2016-08-15 MED ORDER — LAMOTRIGINE 100 MG PO TABS: 200.0000 mg | ORAL_TABLET | Freq: Three times a day (TID) | ORAL | 0 refills | Status: DC

## 2016-08-15 NOTE — Telephone Encounter (Signed)
Refill request from pharmacy /

## 2016-08-15 NOTE — Telephone Encounter (Signed)
Pharmacy called back stated that there were some PA request needed for Keppra and Lamictal. Please call and advise.

## 2016-08-16 ENCOUNTER — Telehealth: Payer: Self-pay | Admitting: *Deleted

## 2016-08-16 NOTE — Telephone Encounter (Signed)
All notes and requested PA information sent in for review, marked urgent.

## 2016-08-16 NOTE — Telephone Encounter (Signed)
Keppra approved by [employer] from 05/18/2016-08/16/2017.  Both approvals faxed to pharmacy

## 2016-08-16 NOTE — Telephone Encounter (Signed)
Patient's mother called to check on status of PA for Lamictal. She stated he had his morning dose, but no more medication. She stated if he misses a dose, he will have seizures. Advised her this message will be routed as urgent to RN. She verbalized understanding, appreciation, stated she needs information very soon.

## 2016-08-16 NOTE — Telephone Encounter (Signed)
See other phone note

## 2016-08-16 NOTE — Telephone Encounter (Signed)
Lamictal approved by [employer] 05/18/2016-08/16/2017.

## 2016-09-02 ENCOUNTER — Encounter: Payer: Self-pay | Admitting: Nurse Practitioner

## 2016-09-02 ENCOUNTER — Ambulatory Visit (INDEPENDENT_AMBULATORY_CARE_PROVIDER_SITE_OTHER): Payer: Medicare Other | Admitting: Nurse Practitioner

## 2016-09-02 VITALS — BP 115/70 | HR 62 | Ht 70.0 in

## 2016-09-02 DIAGNOSIS — G40309 Generalized idiopathic epilepsy and epileptic syndromes, not intractable, without status epilepticus: Secondary | ICD-10-CM | POA: Diagnosis not present

## 2016-09-02 DIAGNOSIS — G808 Other cerebral palsy: Secondary | ICD-10-CM

## 2016-09-02 DIAGNOSIS — G40209 Localization-related (focal) (partial) symptomatic epilepsy and epileptic syndromes with complex partial seizures, not intractable, without status epilepticus: Secondary | ICD-10-CM

## 2016-09-02 MED ORDER — LEVETIRACETAM 500 MG PO TABS: 1500.0000 mg | ORAL_TABLET | Freq: Two times a day (BID) | ORAL | 11 refills | Status: DC

## 2016-09-02 MED ORDER — LAMOTRIGINE 100 MG PO TABS: 200.0000 mg | ORAL_TABLET | Freq: Three times a day (TID) | ORAL | 11 refills | Status: DC
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
## 2016-09-02 NOTE — Patient Instructions (Signed)
Continue Brand Vimpat 150 twice daily Continue brand Lamictal 100 mg 2 tablets 3 times daily will refill Continue brand Keppra 500mg  3  tablets twice daily , will refill Follow-up in 6-8 months

## 2016-09-02 NOTE — Progress Notes (Signed)
I agree with the assessment and plan as directed by NP .The patient is known to me .   Railyn House, MD  

## 2016-09-02 NOTE — Progress Notes (Signed)
Fax confirmation keppra [place] [facility] received. [phone].

## 2016-11-04 DIAGNOSIS — G808 Other cerebral palsy: Secondary | ICD-10-CM | POA: Diagnosis not present

## 2017-01-02 ENCOUNTER — Encounter: Payer: Self-pay | Admitting: Nurse Practitioner

## 2017-01-02 DIAGNOSIS — G40909 Epilepsy, unspecified, not intractable, without status epilepticus: Secondary | ICD-10-CM | POA: Diagnosis not present

## 2017-01-02 DIAGNOSIS — I10 Essential (primary) hypertension: Secondary | ICD-10-CM | POA: Diagnosis not present

## 2017-01-02 DIAGNOSIS — G801 Spastic diplegic cerebral palsy: Secondary | ICD-10-CM | POA: Diagnosis not present

## 2017-01-09 ENCOUNTER — Other Ambulatory Visit: Payer: Self-pay

## 2017-01-09 MED ORDER — VIMPAT 150 MG PO TABS: 150.0000 mg | ORAL_TABLET | Freq: Two times a day (BID) | ORAL | 5 refills | Status: DC

## 2017-01-09 NOTE — Telephone Encounter (Signed)
RX for vimpat faxed to [facility]. Received a receipt of confirmation.

## 2017-01-09 NOTE — Telephone Encounter (Signed)
RX for vimpat did not print when Dr. Brett Fairy approved it. Reprinted. Will await her signature.

## 2017-01-09 NOTE — Addendum Note (Signed)
Addended by: L[place] A on: 01/09/2017 03:52 PM   Modules accepted: Orders

## 2017-02-13 DIAGNOSIS — G808 Other cerebral palsy: Secondary | ICD-10-CM | POA: Diagnosis not present

## 2017-04-12 ENCOUNTER — Ambulatory Visit: Payer: Medicare Other | Admitting: Nurse Practitioner

## 2017-04-26 NOTE — Progress Notes (Deleted)
[facility]  PATIENT: Harry Carr DOB: [birth year]   REASON FOR VISIT: Follow-up for seizure disorder, generalized epilepsy, congenital hemiplegia, dystonia HISTORY FROM: Patient and mother    HISTORY OF PRESENT ILLNESS:UPDATE 12/29/2017[name], 36 year old male returns for follow-up for his history of seizure disorder .cerebral palsy, and left dominant hemiparesis and spasticity. His seizure activity began in 1995. He is accompanied today by his mother.  He is currently on 3 brand drugs having failed generics in the past. He continues to get Botox for spasticity every 3 months at [facility] with Dr. Doy Mince. He reports good appetite and is sleeping well. He remains wheelchair bound outdoors and walks with a walker in the home. He needs medication refills. He continues to have 1-2 seizures a week and some weeks none.    02/02/16 CDInterval history from 02/02/2016, Harry Carr is here today for his follow-up visit he had been followed by Dr. Gaynell Face and Dr. Doy Mince in the past, Harry Carr has seen him for the last 5 years. 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 There is also some dystonia which has responded to Botox. He remains wheelchair bound and outdoors can use a Gator or golf cart to drive. He reports good appetite and sleeps well. He has been failed by generic seizure medicines and therefore changed back to brand name only. We are here to refill his medication and to obtain a Lamictal and Keppra level.    HISTORY OF PRESENT ILLNESS: HISTORY: Harry Carr 36 year old male with Cerebral Palsy, left dominant hemi-paresis, spasticity - Former patient of Dr. Doy Mince (whom he sees In January and April, every 3 months for Botox injection.), He achieved better seizure control on a higher Keppra dose of 1000 mg tid po, Vimpat at 100 mg bid and Lamictal at 100 mg 2 tabs  tid po. ALL BRAND NAME. Harry Carr has seen him for the last 3 years . 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 He used a golf cart to drive around the neighborhood. This worked fine until this year, 2015 .  He sleeps well at night 23:00 to 8 AM, no naps in daytime. He is wheelchair bound. He does goes for weeks without seizures and then will have several seizures back-to-back which are brief, but the mother states overall his seizures are better. His mother called [place] after another cluster of seizure activity. One seizure occurred [place] the zoo, where he ran his wheelchair into the next bench. One happened in his family's yard while in his golf cart and he had an accident. He is now "grounded". He dislikes this reduction in his liberties. He was not febrile and was not forgetting any medications. He was not dehydrated. Some of his spells have been provoked by flickering lights. When looking into the son while driving . When trees intersect his visual field.   UPDATE: 011/30/16:[name], 36 year old male returns for follow-up. He has a history of complex partial seizure disorder cerebral palsy, and left dominant hemiparesis and spasticity. His seizure activity began in 1995. He is accompanied today by his mother. She tells me that he is having more seizures 2-4 seizures a week even though he had an increase in his Keppra dose after his last visit, he is taking 3500 mg daily, Lamictal 200 twice daily, and Vimpat 150 twice daily .  He may have one seizure several days in a week and then go weeks without any. Seizures continue to be brief. No change in the character of his seizures, he now gets a headache after seizure.  His seizures have increased since switching to generic medication because of insurance He is basically wheelchair-bound. [place]. He is able to utilize a  walker in the home since receiving Botox to the left hand. Prior to that, he mostly crawls on the floor due to spastic dystonia of the left wrist. There have been no visual problems, no behavioral problems. Appetite is good, he is sleeping well. He has been receiving Botox in [place] with Dr. Doy Mince.He had recent labs at his primary care office , CMP and CBC ok. Lamictal level 10.2 and Keppra 54.4. He returns for reevaluation     REVIEW OF SYSTEMS: Full 14 system review of systems performed and notable only for those listed, all others are neg:  Constitutional: neg  Cardiovascular: neg Ear/Nose/Throat: neg  Skin: neg Eyes: neg Respiratory: neg Gastroitestinal: neg  Hematology/Lymphatic: neg  Endocrine: neg Musculoskeletal: Difficulty walking Allergy/Immunology: neg Neurological: Seizure disorder Psychiatric: neg Sleep : neg   ALLERGIES: No Known Allergies  HOME MEDICATIONS: Outpatient Medications Prior to Visit  Medication Sig Dispense Refill  . aspirin 81 MG tablet Take 81 mg by mouth daily.    Marland Kitchen lamoTRIgine (LAMICTAL) 100 MG tablet Take 2 tablets (200 mg total) by mouth 3 (three) times daily. 180 tablet 11  . levETIRAcetam (KEPPRA) 500 MG tablet Take 3 tablets (1,500 mg total) by mouth 2 (two) times daily. 180 tablet 11  . losartan (COZAAR) 100 MG tablet Take 100 mg by mouth daily.     . Multiple Vitamin (MULTIVITAMIN) tablet Take 1 tablet by mouth.    . omega-3 fish oil (MAXEPA) 1000 MG CAPS capsule Take 1 capsule by mouth 3 (three) times daily.    Marland Kitchen VIMPAT 150 MG TABS Take 1 tablet (150 mg total) by mouth 2 (two) times daily. 60 tablet 5   No facility-administered medications prior to visit.     PAST MEDICAL HISTORY: Past Medical History:  Diagnosis Date  . Hypertension    mild  . Mild mental retardation   . Seizures ([place])   . 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 dorsal rhizotomy  age 81 yrs  . WISDOM TOOTH EXTRACTION  03/2010    FAMILY HISTORY: Family History  Problem Relation Age of Onset  . Heart attack Father   . Heart disease Maternal Grandmother   . Cancer - Other Maternal Grandfather        lukemia    SOCIAL HISTORY: Social History   Social History  . Marital status: Single    Spouse name: N/A  . Number of children: N/A  . Years of education: N/A   Occupational History  . Not on file.   Social History Main Topics  . Smoking status: Never Smoker  . Smokeless tobacco: Never Used  . Alcohol use Not on file  . Drug use: No  . Sexual activity: Not on file   Other Topics Concern  . Not on file   Social History Narrative   Right handed. Caffeine none.  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     PHYSICAL EXAM  There were no vitals filed for this visit. There is no  height or weight on file to calculate BMI. Generalized: Well developed, in no acute distress  Head: normocephalic and atraumatic,. Oropharynx benign  Neck: Supple, no carotid bruits  Cardiac: Regular rate rhythm, no murmur  Musculoskeletal: Spastic hemiparesis on the left  Neurological examination   Mentation: Alert oriented to time, place, Mild MR. Follows all commands speech dysarthric  Cranial nerve II-XII: Pupils were equal round reactive to light , disconjugate eye movements noted, visual fields are full Facial sensation and strength were normal. hearing was intact to finger rubbing bilaterally. Uvula tongue midline. head turning and shoulder shrug and were normal and symmetric.Tongue protrusion into cheek strength was normal. Decrease shoulder shrug on the left Motor: Tone increased in all 3 extremities with sparing of the right upper extremity. Good grip strength on the right opens fingers of the left hand but spastic dystonia noted with clawlike deformity. Good strength in the right upper extremity and mild weakness both lower  extremities Sensory: Withdraws to pain Coordination: Impaired on the left  Reflexes: Left reflexes are brisker than right no clonus . Gait and Station: Not ambulated    DIAGNOSTIC DATA (LABS, IMAGING, TESTING) - I reviewed patient records, labs, notes, testing and imaging myself where available.      Component Value Date/Time   NA 146 (H) 02/02/2016 1629   K 4.1 02/02/2016 1629   CL 100 02/02/2016 1629   CO2 26 02/02/2016 1629   GLUCOSE 78 02/02/2016 1629   BUN 6 02/02/2016 1629   CREATININE 0.79 02/02/2016 1629   CALCIUM 9.9 02/02/2016 1629   PROT 7.4 02/02/2016 1629   ALBUMIN 4.7 02/02/2016 1629   AST 25 02/02/2016 1629   ALT 11 02/02/2016 1629   ALKPHOS 120 (H) 02/02/2016 1629   BILITOT 0.8 02/02/2016 1629   GFRNONAA 118 02/02/2016 1629   GFRAA 136 02/02/2016 1629    ASSESSMENT AND PLAN  36 y.o. 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 here to follow-up for seizure disorder  PLAN: Continue Brand Vimpat 150 twice daily Continue brand Lamictal 100 mg 2 tablets 3 times daily will refill Continue brand Keppra 500mg  3  tablets twice daily , will refill Follow-up in 6-8 months Dennie Bible, [facility], [facility], APRN  [facility] [address], [place], [place] [address] [phone]

## 2017-04-27 ENCOUNTER — Ambulatory Visit: Payer: Medicare Other | Admitting: Nurse Practitioner

## 2017-04-28 ENCOUNTER — Encounter: Payer: Self-pay | Admitting: Nurse Practitioner

## 2017-05-10 ENCOUNTER — Other Ambulatory Visit: Payer: Self-pay | Admitting: *Deleted

## 2017-05-10 ENCOUNTER — Ambulatory Visit (INDEPENDENT_AMBULATORY_CARE_PROVIDER_SITE_OTHER): Payer: Medicare Other | Admitting: Nurse Practitioner

## 2017-05-10 ENCOUNTER — Encounter: Payer: Self-pay | Admitting: Nurse Practitioner

## 2017-05-10 ENCOUNTER — Encounter: Payer: Self-pay | Admitting: *Deleted

## 2017-05-10 VITALS — BP 121/76 | HR 65

## 2017-05-10 DIAGNOSIS — G40209 Localization-related (focal) (partial) symptomatic epilepsy and epileptic syndromes with complex partial seizures, not intractable, without status epilepticus: Secondary | ICD-10-CM | POA: Diagnosis not present

## 2017-05-10 DIAGNOSIS — G40309 Generalized idiopathic epilepsy and epileptic syndromes, not intractable, without status epilepticus: Secondary | ICD-10-CM | POA: Diagnosis not present

## 2017-05-10 DIAGNOSIS — G808 Other cerebral palsy: Secondary | ICD-10-CM | POA: Diagnosis not present

## 2017-05-10 MED ORDER — LEVETIRACETAM 500 MG PO TABS: 1500.0000 mg | ORAL_TABLET | Freq: Two times a day (BID) | ORAL | 11 refills | Status: DC

## 2017-05-10 MED ORDER — VIMPAT 150 MG PO TABS: 150.0000 mg | ORAL_TABLET | Freq: Two times a day (BID) | ORAL | 5 refills | Status: DC

## 2017-05-10 MED ORDER — LAMOTRIGINE 100 MG PO TABS: 200.0000 mg | ORAL_TABLET | Freq: Three times a day (TID) | ORAL | 11 refills | Status: DC

## 2017-05-10 NOTE — Telephone Encounter (Signed)
Refill Rx for vimpat, keppra and lamictal faxed to [facility].

## 2017-05-10 NOTE — Patient Instructions (Signed)
Continue Brand Vimpat 150 twice daily Continue brand Lamictal 100 mg 2 tablets 3 times daily will refill Continue brand Keppra 500mg  3  tablets twice daily , will refill Follow-up in -8 months

## 2017-05-10 NOTE — Progress Notes (Signed)
[facility]  PATIENT: Harry Carr DOB: [birth year]   REASON FOR VISIT: Follow-up for seizure disorder, generalized epilepsy, congenital hemiplegia, dystonia HISTORY FROM: Patient and mother    HISTORY OF PRESENT ILLNESS:UPDATE 05/10/17 CMMr. Kanady, 36 year old male returns for follow-up with history of seizure disorder left dominant hemiparesis and spasticity, cerebral palsy. He continues to get Botox every 3 months with Dr. Doy Mince for his spasticity which has been very beneficial. He remains on 3 brand drugs having failed generics in the past. No interval medical issues. He remains wheelchair bound. He continues to have 1-2 seizures a week. He returns for reevaluation   UPDATE 12/29/2017CM [name], 36 year old male returns for follow-up for his history of seizure disorder .cerebral palsy, and left dominant hemiparesis and spasticity. His seizure activity began in 1995. He is accompanied today by his mother.  He is currently on 3 brand drugs having failed generics in the past. He continues to get Botox for spasticity every 3 months at [facility] with Dr. Doy Mince. He reports good appetite and is sleeping well. He remains wheelchair bound outdoors and walks with a walker in the home. He needs medication refills. He continues to have 1-2 seizures a week and some weeks none.    02/02/16 CDInterval history from 02/02/2016, Harry Carr is here today for his follow-up visit he had been followed by Dr. Gaynell Face and Dr. Doy Mince in the past, [name] for the last 5 years. 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 There is also some dystonia which has responded to Botox. He remains wheelchair bound and outdoors can use a Gator or golf cart to drive. He reports good appetite and sleeps well. He has been failed by generic seizure medicines and therefore changed back to brand  name only. We are here to refill his medication and to obtain a Lamictal and Keppra level.    HISTORY OF PRESENT ILLNESS: HISTORY: Harry Carr 36 year old male with Cerebral Palsy, left dominant hemi-paresis, spasticity - Former patient of Dr. Doy Mince (whom he sees In January and April, every 3 months for Botox injection.), He achieved better seizure control on a higher Keppra dose of 1000 mg tid po, Vimpat at 100 mg bid and Lamictal at 100 mg 2 tabs tid po. ALL BRAND NAME. [name] for the last 3 years . 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 He used a golf cart to drive around the neighborhood. This worked fine until this year, 2015 .  He sleeps well at night 23:00 to 8 AM, no naps in daytime. He is wheelchair bound. He does goes for weeks without seizures and then will have several seizures back-to-back which are brief, but the mother states overall his seizures are better. His mother called [place] after another cluster of seizure activity. One seizure occurred at the zoo, where he ran his wheelchair into the next bench. One happened in his family's yard while in his golf cart and he had an accident. He is now "grounded". He dislikes this reduction in his liberties. He was not febrile and was not forgetting any medications. He was not dehydrated. Some of his spells have been provoked by flickering lights. When looking into the son while driving . When trees intersect his visual field.   UPDATE: 011/30/16:[name], 36 year old male returns  for follow-up. He has a history of complex partial seizure disorder cerebral palsy, and left dominant hemiparesis and spasticity. His seizure activity began in 1995. He is accompanied today by his mother. She tells me that he is having more seizures 2-4 seizures a week even though he had an increase in his Keppra dose after his  last visit, he is taking 3500 mg daily, Lamictal 200 twice daily, and Vimpat 150 twice daily . He may have one seizure several days in a week and then go weeks without any. Seizures continue to be brief. No change in the character of his seizures, he now gets a headache after seizure.  His seizures have increased since switching to generic medication because of insurance He is basically wheelchair-bound. [place] farm. He is able to utilize a walker in the home since receiving Botox to the left hand. Prior to that, he mostly crawls on the floor due to spastic dystonia of the left wrist. There have been no visual problems, no behavioral problems. Appetite is good, he is sleeping well. He has been receiving Botox in [place] with Dr. Doy Mince.He had recent labs at his primary care office , CMP and CBC ok. Lamictal level 10.2 and Keppra 54.4. He returns for reevaluation     REVIEW OF SYSTEMS: Full 14 system review of systems performed and notable only for those listed, all others are neg:  Constitutional: neg  Cardiovascular: neg Ear/Nose/Throat: neg  Skin: neg Eyes: neg Respiratory: neg Gastroitestinal: neg  Hematology/Lymphatic: neg  Endocrine: neg Musculoskeletal: Difficulty walking Allergy/Immunology: neg Neurological: Seizure disorder Psychiatric: neg Sleep : neg   ALLERGIES: No Known Allergies  HOME MEDICATIONS: Outpatient Medications Prior to Visit  Medication Sig Dispense Refill  . aspirin 81 MG tablet Take 81 mg by mouth daily.    Marland Kitchen lamoTRIgine (LAMICTAL) 100 MG tablet Take 2 tablets (200 mg total) by mouth 3 (three) times daily. 180 tablet 11  . levETIRAcetam (KEPPRA) 500 MG tablet Take 3 tablets (1,500 mg total) by mouth 2 (two) times daily. 180 tablet 11  . losartan (COZAAR) 100 MG tablet Take 100 mg by mouth daily.     . Multiple Vitamin (MULTIVITAMIN) tablet Take 1 tablet by mouth.    . omega-3 fish oil (MAXEPA) 1000 MG CAPS capsule Take 1 capsule by mouth  3 (three) times daily.    Marland Kitchen VIMPAT 150 MG TABS Take 1 tablet (150 mg total) by mouth 2 (two) times daily. 60 tablet 5   No facility-administered medications prior to visit.     PAST MEDICAL HISTORY: Past Medical History:  Diagnosis Date  . Hypertension    mild  . Mild mental retardation   . Seizures ([place])   . 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 dorsal rhizotomy  age 51 yrs  . WISDOM TOOTH EXTRACTION  03/2010    FAMILY HISTORY: Family History  Problem Relation Age of Onset  . Heart attack Father   . Heart disease Maternal Grandmother   . Cancer - Other Maternal Grandfather        lukemia    SOCIAL HISTORY: Social History   Social History  . Marital status: Single    Spouse name: N/A  . Number of children: N/A  . Years of education: N/A   Occupational History  . Not on file.   Social History Main Topics  . Smoking status: Never Smoker  .  Smokeless tobacco: Never Used  . Alcohol use Not on file  . Drug use: No  . Sexual activity: Not on file   Other Topics Concern  . Not on file   Social History Narrative   Right handed. Caffeine none.  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     PHYSICAL EXAM  Vitals:   05/10/17 1124  BP: 121/76  Pulse: 65   There is no height or weight on file to calculate BMI. Generalized: Well developed, in no acute distress  Head: normocephalic and atraumatic,. Oropharynx benign  Neck: Supple,  Musculoskeletal: Spastic hemiparesis on the left  Neurological examination   Mentation: Alert oriented to time, place, Mild MR. Follows all commands speech dysarthric  Cranial nerve II-XII: Pupils were equal round reactive to light , disconjugate eye movements noted, visual fields are full Facial sensation and strength were normal. hearing was intact to finger rubbing bilaterally. Uvula tongue midline. head turning  and shoulder shrug and were normal and symmetric.Tongue protrusion into cheek strength was normal. Decrease shoulder shrug on the left Motor: Tone increased in all 3 extremities with sparing of the right upper extremity. Good grip strength on the right opens fingers of the left hand but spastic dystonia noted with clawlike deformity. Good strength in the right upper extremity and mild weakness both lower extremities Sensory: Withdraws to pain Coordination: Impaired on the left  Reflexes: Left reflexes are brisker than right no clonus . Gait and Station: Not ambulated Due to safety concerns, in  wheelchair   DIAGNOSTIC DATA (LABS, IMAGING, TESTING) - I reviewed patient records, labs, notes, testing and imaging myself where available.      Component Value Date/Time   NA 146 (H) 02/02/2016 1629   K 4.1 02/02/2016 1629   CL 100 02/02/2016 1629   CO2 26 02/02/2016 1629   GLUCOSE 78 02/02/2016 1629   BUN 6 02/02/2016 1629   CREATININE 0.79 02/02/2016 1629   CALCIUM 9.9 02/02/2016 1629   PROT 7.4 02/02/2016 1629   ALBUMIN 4.7 02/02/2016 1629   AST 25 02/02/2016 1629   ALT 11 02/02/2016 1629   ALKPHOS 120 (H) 02/02/2016 1629   BILITOT 0.8 02/02/2016 1629   GFRNONAA 118 02/02/2016 1629   GFRAA 136 02/02/2016 1629    ASSESSMENT AND PLAN  36 y.o. 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 here to follow-up for seizure disorder  PLAN: Continue Brand Vimpat 150 twice daily Continue brand Lamictal 100 mg 2 tablets 3 times daily will refill Continue brand Keppra 500mg  3  tablets twice daily , will refill Follow-up in -8 months Dennie Bible, [facility], [facility], APRN  [facility] [address], [place], [place] [address] [phone]

## 2017-06-09 DIAGNOSIS — G808 Other cerebral palsy: Secondary | ICD-10-CM | POA: Diagnosis not present

## 2017-07-04 DIAGNOSIS — G40909 Epilepsy, unspecified, not intractable, without status epilepticus: Secondary | ICD-10-CM | POA: Diagnosis not present

## 2017-07-04 DIAGNOSIS — G801 Spastic diplegic cerebral palsy: Secondary | ICD-10-CM | POA: Diagnosis not present

## 2017-07-04 DIAGNOSIS — I10 Essential (primary) hypertension: Secondary | ICD-10-CM | POA: Diagnosis not present

## 2017-07-12 ENCOUNTER — Telehealth: Payer: Self-pay | Admitting: Nurse Practitioner

## 2017-07-12 NOTE — Telephone Encounter (Signed)
Pt's mother called request letter of necessity for brand name keppra and lamictal sent to [place] for open enrollment (same as last year). It is not on the formulary this year. Please send asap

## 2017-07-13 ENCOUNTER — Encounter: Payer: Self-pay | Admitting: *Deleted

## 2017-07-13 NOTE — Telephone Encounter (Addendum)
Received two fax from [place] stating [place], Lamictal approved for non-formulary. Keppra approved 04/14/17 thru 07/13/2018 Lamictal approved 04/14/17 thru 07/13/2018 Member ID [id number]  Called patient's mother and informed her. She verbalized understanding, appreciation.

## 2017-07-13 NOTE — Telephone Encounter (Signed)
LVM for mother requesting her to call back.

## 2017-07-13 NOTE — Telephone Encounter (Signed)
Patients mother called back, please call back at [phone].

## 2017-07-13 NOTE — Telephone Encounter (Signed)
[place], spoke with Vikki Ports to discuss mother's phone call. She stated that meds were refilled recently and no PA required; current PA effective until 08/16/17. She stated patient's insurance they have on file is Medicare Silver Script; this office has no record of that insurance card. Will call mother to advise this office needs most current insurance card on file. She gave insurance information so PA can be initiated.  Medicare Silver Script  ID: [id number] BIN [id number] Grp: [id number] PCN [id number] Billed by [facility] PA intiated on CMM.  Called mother and advised her PA was initiated on CMM. Requested she bring in most current insurance card when in office the next time. Advised her a decision should be rendered in 1-3 days, and if no decision by next Mon, this RN will call. She will be notified of PA status when available.  She verbalized understanding, appreciation.

## 2017-09-15 DIAGNOSIS — G808 Other cerebral palsy: Secondary | ICD-10-CM | POA: Diagnosis not present

## 2017-11-22 ENCOUNTER — Other Ambulatory Visit: Payer: Self-pay | Admitting: *Deleted

## 2017-11-22 ENCOUNTER — Telehealth: Payer: Self-pay | Admitting: *Deleted

## 2017-11-22 MED ORDER — VIMPAT 150 MG PO TABS: 150.0000 mg | ORAL_TABLET | Freq: Two times a day (BID) | ORAL | 5 refills | Status: DC

## 2017-11-22 NOTE — Telephone Encounter (Signed)
Vimpat refill Rx successfully faxed to [facility].

## 2017-12-15 DIAGNOSIS — G808 Other cerebral palsy: Secondary | ICD-10-CM | POA: Diagnosis not present

## 2018-01-02 DIAGNOSIS — G40909 Epilepsy, unspecified, not intractable, without status epilepticus: Secondary | ICD-10-CM | POA: Diagnosis not present

## 2018-01-02 DIAGNOSIS — I10 Essential (primary) hypertension: Secondary | ICD-10-CM | POA: Diagnosis not present

## 2018-01-02 DIAGNOSIS — G801 Spastic diplegic cerebral palsy: Secondary | ICD-10-CM | POA: Diagnosis not present
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
## 2018-01-09 ENCOUNTER — Ambulatory Visit (INDEPENDENT_AMBULATORY_CARE_PROVIDER_SITE_OTHER): Payer: Medicare Other | Admitting: Nurse Practitioner

## 2018-01-09 ENCOUNTER — Encounter: Payer: Self-pay | Admitting: Nurse Practitioner

## 2018-01-09 VITALS — BP 117/73 | HR 65 | Ht 70.0 in

## 2018-01-09 DIAGNOSIS — G249 Dystonia, unspecified: Secondary | ICD-10-CM

## 2018-01-09 DIAGNOSIS — G808 Other cerebral palsy: Secondary | ICD-10-CM | POA: Diagnosis not present

## 2018-01-09 DIAGNOSIS — G40309 Generalized idiopathic epilepsy and epileptic syndromes, not intractable, without status epilepticus: Secondary | ICD-10-CM

## 2018-01-09 DIAGNOSIS — G40209 Localization-related (focal) (partial) symptomatic epilepsy and epileptic syndromes with complex partial seizures, not intractable, without status epilepticus: Secondary | ICD-10-CM | POA: Diagnosis not present

## 2018-01-09 NOTE — Patient Instructions (Addendum)
Continue Brand Vimpat 150 twice daily Continue brand Lamictal 100 mg 2 tablets 3 times daily  Continue brand Keppra 500mg  3  tablets twice daily ,  Pt needs new wheelchair Follow-up in -8 months

## 2018-01-11 NOTE — Progress Notes (Signed)
I agree with the assessment and plan as directed by NP .The patient is known to me .   Barbie Croston, MD  

## 2018-03-23 DIAGNOSIS — G808 Other cerebral palsy: Secondary | ICD-10-CM | POA: Diagnosis not present

## 2018-05-21 ENCOUNTER — Other Ambulatory Visit: Payer: Self-pay | Admitting: Nurse Practitioner

## 2018-05-21 ENCOUNTER — Telehealth: Payer: Self-pay | Admitting: *Deleted

## 2018-05-21 DIAGNOSIS — G40209 Localization-related (focal) (partial) symptomatic epilepsy and epileptic syndromes with complex partial seizures, not intractable, without status epilepticus: Secondary | ICD-10-CM

## 2018-05-21 MED ORDER — LEVETIRACETAM 500 MG PO TABS: 1500.0000 mg | ORAL_TABLET | Freq: Two times a day (BID) | ORAL | 11 refills | Status: DC

## 2018-05-21 MED ORDER — LAMOTRIGINE 100 MG PO TABS: 200.0000 mg | ORAL_TABLET | Freq: Three times a day (TID) | ORAL | 11 refills | Status: DC

## 2018-05-21 MED ORDER — VIMPAT 150 MG PO TABS: 150.0000 mg | ORAL_TABLET | Freq: Two times a day (BID) | ORAL | 5 refills | Status: DC

## 2018-05-21 NOTE — Telephone Encounter (Signed)
Received fax requests to refill Keppra, Lamictal, brand only for both. Refills sent electronically. Vimpat refill order on NP's desk for completion.

## 2018-05-21 NOTE — Telephone Encounter (Signed)
Vimpat refill Rx faxed to [facility].

## 2018-06-17 DIAGNOSIS — H6123 Impacted cerumen, bilateral: Secondary | ICD-10-CM | POA: Diagnosis not present

## 2018-07-04 DIAGNOSIS — I10 Essential (primary) hypertension: Secondary | ICD-10-CM | POA: Diagnosis not present

## 2018-07-04 DIAGNOSIS — G801 Spastic diplegic cerebral palsy: Secondary | ICD-10-CM | POA: Diagnosis not present

## 2018-07-04 DIAGNOSIS — R2689 Other abnormalities of gait and mobility: Secondary | ICD-10-CM | POA: Diagnosis not present

## 2018-07-04 DIAGNOSIS — G40909 Epilepsy, unspecified, not intractable, without status epilepticus: Secondary | ICD-10-CM | POA: Diagnosis not present

## 2018-07-20 DIAGNOSIS — G808 Other cerebral palsy: Secondary | ICD-10-CM | POA: Diagnosis not present

## 2018-07-24 ENCOUNTER — Telehealth: Payer: Self-pay | Admitting: Nurse Practitioner

## 2018-07-24 NOTE — Telephone Encounter (Signed)
Pts mother [name] called stating that medication lamoTRIgine (LAMICTAL) 100 MG tablet and levETIRAcetam (KEPPRA) 500 MG tablet are needing PA

## 2018-07-24 NOTE — Telephone Encounter (Signed)
SPoke with [facility] pharmacy inquiring about patient's call re: PA for lamictal and Keppra. She stated it was last filled the end of Oct without difficulty. She ran meds through and stated they do need new PA. She will initiate PA on cover my meds.  07/25/18 PA for Lamictal, brand initiated on CMM, key: [id number].  Your information has been submitted to [place] Medicare Part D. [employer] will review the request and will issue a decision, typically within 1-3 days from your submission.  If [employer] has not responded in 1-3 days or if you have any questions about your ePA request, please contact [place] Medicare Part D at [phone]. Lamictal brand approved , [employer] Medicare, covered 04/26/18 - 09/15/2018. Faxed approval letter to [facility] and advised them this RN did not receive PA for Keppra.

## 2018-07-27 NOTE — Telephone Encounter (Signed)
Called pharmacy and spoke with [place] him this RN has not gotten PA request for Keppra.  He stated he would refax it. Gave him fax number in this RN's pod.

## 2018-07-27 NOTE — Telephone Encounter (Addendum)
CMM Keppra PA key: [id number] completed, approved. Coverage from 04/28/18 - 07/27/2019. Faxed approval letter to [facility].

## 2018-07-30 NOTE — Telephone Encounter (Signed)
Fax received from [place], phone# [phone]. Keppra PA approved for dates 04/28/18-07/27/19.  Member ID: [id number]
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
## 2018-09-12 ENCOUNTER — Ambulatory Visit (INDEPENDENT_AMBULATORY_CARE_PROVIDER_SITE_OTHER): Payer: Medicare Other | Admitting: Nurse Practitioner

## 2018-09-12 ENCOUNTER — Encounter: Payer: Self-pay | Admitting: Nurse Practitioner

## 2018-09-12 VITALS — BP 114/66 | HR 64 | Ht 70.0 in

## 2018-09-12 DIAGNOSIS — G249 Dystonia, unspecified: Secondary | ICD-10-CM | POA: Diagnosis not present

## 2018-09-12 DIAGNOSIS — G40309 Generalized idiopathic epilepsy and epileptic syndromes, not intractable, without status epilepticus: Secondary | ICD-10-CM

## 2018-09-12 DIAGNOSIS — G808 Other cerebral palsy: Secondary | ICD-10-CM | POA: Diagnosis not present

## 2018-09-12 DIAGNOSIS — G40209 Localization-related (focal) (partial) symptomatic epilepsy and epileptic syndromes with complex partial seizures, not intractable, without status epilepticus: Secondary | ICD-10-CM | POA: Diagnosis not present

## 2018-09-12 NOTE — Patient Instructions (Addendum)
Continue Brand Vimpat 150 twice dailydoes not need refills Continue brand Lamictal 100 mg 2 tablets 3 times daily does not need refills Continue brand Keppra 500mg  3  tablets twice daily , does not need refills Follow-up in -8 months

## 2018-10-05 DIAGNOSIS — G801 Spastic diplegic cerebral palsy: Secondary | ICD-10-CM | POA: Diagnosis not present

## 2018-10-26 DIAGNOSIS — G808 Other cerebral palsy: Secondary | ICD-10-CM | POA: Diagnosis not present

## 2018-11-20 ENCOUNTER — Other Ambulatory Visit: Payer: Self-pay | Admitting: *Deleted

## 2018-11-20 MED ORDER — VIMPAT 150 MG PO TABS: 150.0000 mg | ORAL_TABLET | Freq: Two times a day (BID) | ORAL | 5 refills | Status: DC

## 2018-11-20 NOTE — Telephone Encounter (Signed)
Drug Registry checked Vimpat 150mg  #60 last fill 10-29-18.

## 2019-02-12 DIAGNOSIS — I10 Essential (primary) hypertension: Secondary | ICD-10-CM | POA: Diagnosis not present

## 2019-02-12 DIAGNOSIS — Z6824 Body mass index (BMI) 24.0-24.9, adult: Secondary | ICD-10-CM | POA: Diagnosis not present

## 2019-02-12 DIAGNOSIS — Z1159 Encounter for screening for other viral diseases: Secondary | ICD-10-CM | POA: Diagnosis not present

## 2019-02-12 DIAGNOSIS — G801 Spastic diplegic cerebral palsy: Secondary | ICD-10-CM | POA: Diagnosis not present

## 2019-02-12 DIAGNOSIS — G40909 Epilepsy, unspecified, not intractable, without status epilepticus: Secondary | ICD-10-CM | POA: Diagnosis not present

## 2019-04-07 DIAGNOSIS — J90 Pleural effusion, not elsewhere classified: Secondary | ICD-10-CM | POA: Diagnosis not present

## 2019-04-07 DIAGNOSIS — G809 Cerebral palsy, unspecified: Secondary | ICD-10-CM | POA: Diagnosis not present

## 2019-04-07 DIAGNOSIS — J189 Pneumonia, unspecified organism: Secondary | ICD-10-CM | POA: Diagnosis not present

## 2019-04-07 DIAGNOSIS — T17998A Other foreign object in respiratory tract, part unspecified causing other injury, initial encounter: Secondary | ICD-10-CM | POA: Diagnosis not present

## 2019-04-07 DIAGNOSIS — G40909 Epilepsy, unspecified, not intractable, without status epilepticus: Secondary | ICD-10-CM | POA: Diagnosis not present

## 2019-04-09 ENCOUNTER — Telehealth: Payer: Self-pay | Admitting: Neurology

## 2019-04-09 NOTE — Telephone Encounter (Signed)
I will keep the patient on my list and if I see an opening become available I will see if we can get the pt seen sooner.

## 2019-04-09 NOTE — Telephone Encounter (Signed)
Pt's mother called stating that they had to take the pt to the Emergency Room on Sunday due to several seizures the pt was having. Pt is scheduled on Sept 8th and his mother is wanting to know if it is ok to wait that long or do they have to be seen sooner. Please advise.

## 2019-04-09 NOTE — Telephone Encounter (Signed)
Spoke with pt's mother [name] (on [place]). She stated pt had a seizure around 5 pm on Sunday. It lasted 15+ minutes. Pt went to ED, vomited, aspirated but is now home and stable. She realized she had forgotten to give the pt his morning seizure medication that day. Nothing else had changed. He has been taking his Lamotrigine, Keppra, and Vimpat as prescribed. She stated for months pt has been taking Lamotrigine 3 tablets AM and 3 tablets PM rather than 2 tablets TID (approved by Dr. Brett Fairy). However this has been stable for months. He averages sx 1-2 times per week but they are "minor". He hasn't had a seizure like this in years and she believes it was from the missed medication. She is fine with waiting till the September appt w/ Dr. Brett Fairy but is fine to bring pt in sooner if needed. She verbalized appreciation for the call.

## 2019-04-10 NOTE — Telephone Encounter (Signed)
Ok to follow up in next 4-6 weeks. -VRP

## 2019-04-10 NOTE — Telephone Encounter (Signed)
Spoke with pt's mother Harry Carr and advised I sent a message to the work-in physician. Patient can keep his existing appt on 05/14/2019 @ 10:30 which would fall within the next 4-6 weeks. She verbalized understanding and appreciation for the call.

## 2019-04-26 ENCOUNTER — Telehealth: Payer: Self-pay | Admitting: Nurse Practitioner

## 2019-04-26 DIAGNOSIS — G809 Cerebral palsy, unspecified: Secondary | ICD-10-CM | POA: Diagnosis not present

## 2019-04-26 DIAGNOSIS — G40909 Epilepsy, unspecified, not intractable, without status epilepticus: Secondary | ICD-10-CM | POA: Diagnosis not present

## 2019-04-26 DIAGNOSIS — Z79899 Other long term (current) drug therapy: Secondary | ICD-10-CM | POA: Diagnosis not present

## 2019-04-26 NOTE — Telephone Encounter (Signed)
Pt mother(on DPR)has called to report pt was discharged from the hospital about 3 a.m. this morning.  Pt and mother were advised to contact pt's Neurologist as soon as possible.  Mother made aware message would be sent for RN to call her on Monday

## 2019-04-29 NOTE — Telephone Encounter (Signed)
Called the patient's mother. There was no answer. I have a 3:30 pm opening on 8/26. I have blocked and left a message offering to the patient to get him seen sooner.

## 2019-04-29 NOTE — Telephone Encounter (Signed)
Pt mother called back and she accepted the o.v. for 08-26 with 3pm check in.  This is FYI no call back requested

## 2019-04-30 DIAGNOSIS — G40401 Other generalized epilepsy and epileptic syndromes, not intractable, with status epilepticus: Secondary | ICD-10-CM | POA: Diagnosis not present

## 2019-04-30 DIAGNOSIS — I10 Essential (primary) hypertension: Secondary | ICD-10-CM | POA: Diagnosis not present

## 2019-04-30 DIAGNOSIS — Z1159 Encounter for screening for other viral diseases: Secondary | ICD-10-CM | POA: Diagnosis not present

## 2019-04-30 DIAGNOSIS — G801 Spastic diplegic cerebral palsy: Secondary | ICD-10-CM | POA: Diagnosis not present

## 2019-05-01 ENCOUNTER — Other Ambulatory Visit: Payer: Self-pay

## 2019-05-01 ENCOUNTER — Encounter: Payer: Self-pay | Admitting: Neurology

## 2019-05-01 ENCOUNTER — Ambulatory Visit (INDEPENDENT_AMBULATORY_CARE_PROVIDER_SITE_OTHER): Payer: Medicare Other | Admitting: Neurology

## 2019-05-01 ENCOUNTER — Other Ambulatory Visit: Payer: Self-pay | Admitting: Neurology

## 2019-05-01 DIAGNOSIS — G40209 Localization-related (focal) (partial) symptomatic epilepsy and epileptic syndromes with complex partial seizures, not intractable, without status epilepticus: Secondary | ICD-10-CM

## 2019-05-01 MED ORDER — BUTALBITAL-APAP-CAFFEINE 50-325-40 MG PO TABS: 1.0000 | ORAL_TABLET | Freq: Four times a day (QID) | ORAL | 3 refills | Status: DC | PRN

## 2019-05-01 MED ORDER — LAMOTRIGINE 100 MG PO TABS: ORAL_TABLET | ORAL | 11 refills | Status: DC

## 2019-05-01 MED ORDER — LEVETIRACETAM 500 MG PO TABS: 1500.0000 mg | ORAL_TABLET | Freq: Two times a day (BID) | ORAL | 11 refills | Status: DC

## 2019-05-01 MED ORDER — LAMOTRIGINE 100 MG PO TABS: 200.0000 mg | ORAL_TABLET | Freq: Three times a day (TID) | ORAL | 11 refills | Status: DC

## 2019-05-01 MED ORDER — VALTOCO 20 MG DOSE 10 MG/0.1ML NA LQPK: 1.0000 | NASAL | 3 refills | Status: DC | PRN

## 2019-05-01 MED ORDER — VIMPAT 150 MG PO TABS: 150.0000 mg | ORAL_TABLET | Freq: Two times a day (BID) | ORAL | 5 refills | Status: DC
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
## 2019-05-01 NOTE — Addendum Note (Signed)
Addended by: Larey Seat on: 05/01/2019 04:14 PM   Modules accepted: Orders

## 2019-05-01 NOTE — Addendum Note (Signed)
Addended by: Larey Seat on: 05/01/2019 04:12 PM   Modules accepted: Orders

## 2019-05-14 ENCOUNTER — Ambulatory Visit: Payer: Medicare Other | Admitting: Neurology

## 2019-05-16 DIAGNOSIS — H9201 Otalgia, right ear: Secondary | ICD-10-CM | POA: Diagnosis not present

## 2019-05-16 DIAGNOSIS — Z1159 Encounter for screening for other viral diseases: Secondary | ICD-10-CM | POA: Diagnosis not present

## 2019-06-21 DIAGNOSIS — G808 Other cerebral palsy: Secondary | ICD-10-CM | POA: Diagnosis not present

## 2019-07-23 ENCOUNTER — Telehealth: Payer: Self-pay | Admitting: Neurology

## 2019-07-23 NOTE — Telephone Encounter (Signed)
A submitted and completed on cover my meds through [facility] [employer].  [id number] We will hear a response in 1-3 days

## 2019-07-23 NOTE — Telephone Encounter (Signed)
PA approved from [employer] 04/24/2019-11/17/20214

## 2019-07-24 ENCOUNTER — Telehealth: Payer: Self-pay | Admitting: Neurology

## 2019-07-24 NOTE — Telephone Encounter (Signed)
PA completed through cover my meds/ [employer],[facility]. KEY:: [id number] Approved through [facility]- 04/25/2019 - 07/23/2020

## 2019-07-31 ENCOUNTER — Ambulatory Visit: Payer: Medicare Other | Admitting: Neurology

## 2019-09-27 DIAGNOSIS — G808 Other cerebral palsy: Secondary | ICD-10-CM | POA: Diagnosis not present

## 2019-10-08 ENCOUNTER — Other Ambulatory Visit: Payer: Self-pay

## 2019-10-08 MED ORDER — LOSARTAN POTASSIUM 100 MG PO TABS: 100.0000 mg | ORAL_TABLET | Freq: Every day | ORAL | 2 refills | Status: DC

## 2019-11-05 ENCOUNTER — Other Ambulatory Visit: Payer: Self-pay

## 2019-11-05 ENCOUNTER — Ambulatory Visit (INDEPENDENT_AMBULATORY_CARE_PROVIDER_SITE_OTHER): Payer: Medicare Other | Admitting: Family Medicine

## 2019-11-05 ENCOUNTER — Encounter: Payer: Self-pay | Admitting: Family Medicine

## 2019-11-05 VITALS — BP 119/71 | HR 63 | Temp 97.6°F | Ht 70.0 in | Wt 170.0 lb

## 2019-11-05 DIAGNOSIS — G40209 Localization-related (focal) (partial) symptomatic epilepsy and epileptic syndromes with complex partial seizures, not intractable, without status epilepticus: Secondary | ICD-10-CM | POA: Diagnosis not present

## 2019-11-05 DIAGNOSIS — G808 Other cerebral palsy: Secondary | ICD-10-CM

## 2019-11-05 NOTE — Patient Instructions (Signed)
Continue Vimpat, Lamictal and Keppra.   Continue diazepam nasal spray   Follow up in 1 year, sooner if needed   Seizure, Adult A seizure is a sudden burst of abnormal electrical activity in the brain. Seizures usually last from 30 seconds to 2 minutes. They can cause many different symptoms. Usually, seizures are not harmful unless they last a long time. What are the causes? Common causes of this condition include:  Fever or infection.  Conditions that affect the brain, such as: ? A brain abnormality that you were born with. ? A brain or head injury. ? Bleeding in the brain. ? A tumor. ? Stroke. ? Brain disorders such as autism or cerebral palsy.  Low blood sugar.  Conditions that are passed from parent to child (are inherited).  Problems with substances, such as: ? Having a reaction to a drug or a medicine. ? Suddenly stopping the use of a substance (withdrawal). In some cases, the cause may not be known. A person who has repeated seizures over time without a clear cause has a condition called epilepsy. What increases the risk? You are more likely to get this condition if you have:  A family history of epilepsy.  Had a seizure in the past.  A brain disorder.  A history of head injury, lack of oxygen at birth, or strokes. What are the signs or symptoms? There are many types of seizures. The symptoms vary depending on the type of seizure you have. Examples of symptoms during a seizure include:  Shaking (convulsions).  Stiffness in the body.  Passing out (losing consciousness).  Head nodding.  Staring.  Not responding to sound or touch.  Loss of bladder control and bowel control. Some people have symptoms right before and right after a seizure happens. Symptoms before a seizure may include:  Fear.  Worry (anxiety).  Feeling like you may vomit (nauseous).  Feeling like the room is spinning (vertigo).  Feeling like you saw or heard something before (dj  vu).  Odd tastes or smells.  Changes in how you see. You may see flashing lights or spots. Symptoms after a seizure happens can include:  Confusion.  Sleepiness.  Headache.  Weakness on one side of the body. How is this treated? Most seizures will stop on their own in under 5 minutes. In these cases, no treatment is needed. Seizures that last longer than 5 minutes will usually need treatment. Treatment can include:  Medicines given through an IV tube.  Avoiding things that are known to cause your seizures. These can include medicines that you take for another condition.  Medicines to treat epilepsy.  Surgery to stop the seizures. This may be needed if medicines do not help. Follow these instructions at home: Medicines  Take over-the-counter and prescription medicines only as told by your doctor.  Do not eat or drink anything that may keep your medicine from working, such as alcohol. Activity  Do not do any activities that would be dangerous if you had another seizure, like driving or swimming. Wait until your doctor says it is safe for you to do them.  If you live in the [place] ask your local DMV (department of motor vehicles) when you can drive.  Get plenty of rest. Teaching others Teach friends and family what to do when you have a seizure. They should:  Lay you on the ground.  Protect your head and body.  Loosen any tight clothing around your neck.  Turn you on your side.  Not hold you down.  Not put anything into your mouth.  Know whether or not you need emergency care.  Stay with you until you are better.  General instructions  Contact your doctor each time you have a seizure.  Avoid anything that gives you seizures.  Keep a seizure diary. Write down: ? What you think caused each seizure. ? What you remember about each seizure.  Keep all follow-up visits as told by your doctor. This is important. Contact a doctor if:  You have another seizure.   You have seizures more often.  There is any change in what happens during your seizures.  You keep having seizures with treatment.  You have symptoms of being sick or having an infection. Get help right away if:  You have a seizure that: ? Lasts longer than 5 minutes. ? Is different than seizures you had before. ? Makes it harder to breathe. ? Happens after you hurt your head.  You have any of these symptoms after a seizure: ? Not being able to speak. ? Not being able to use a part of your body. ? Confusion. ? A bad headache.  You have two or more seizures in a row.  You do not wake up right after a seizure.  You get hurt during a seizure. These symptoms may be an emergency. Do not wait to see if the symptoms will go away. Get medical help right away. Call your local emergency services (911 in the [place] Do not drive yourself to the hospital. Summary  Seizures usually last from 30 seconds to 2 minutes. Usually, they are not harmful unless they last a long time.  Do not eat or drink anything that may keep your medicine from working, such as alcohol.  Teach friends and family what to do when you have a seizure.  Contact your doctor each time you have a seizure. This information is not intended to replace advice given to you by your health care provider. Make sure you discuss any questions you have with your health care provider. Document Revised: 11/09/2018 Document Reviewed: 11/09/2018 Elsevier Patient Education  [place].
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
## 2019-11-07 ENCOUNTER — Encounter: Payer: Self-pay | Admitting: Family Medicine

## 2019-11-07 ENCOUNTER — Other Ambulatory Visit: Payer: Self-pay | Admitting: Neurology

## 2019-11-07 MED ORDER — VIMPAT 150 MG PO TABS: 150.0000 mg | ORAL_TABLET | Freq: Two times a day (BID) | ORAL | 5 refills | Status: DC

## 2019-12-27 DIAGNOSIS — G808 Other cerebral palsy: Secondary | ICD-10-CM | POA: Diagnosis not present

## 2020-01-14 ENCOUNTER — Other Ambulatory Visit: Payer: Self-pay | Admitting: Neurology

## 2020-01-14 MED ORDER — VALTOCO 20 MG DOSE 10 MG/0.1ML NA LQPK: 1.0000 | NASAL | 3 refills | Status: DC | PRN

## 2020-01-21 ENCOUNTER — Telehealth: Payer: Self-pay | Admitting: Family Medicine

## 2020-01-21 NOTE — Telephone Encounter (Signed)
Please let mom know that I have reviewed his notes. It is hard to know what could be causing the jerking. If the bananas have helped it could be related to a low potassium level. Has he had lab work recently? If not, I would recommend he have labs to check his levels. PCP is usually a great resource for this. Magnesium can also be helpful for muscle aches/spasms. I would have them run this by Dr Doy Mince, neurologist at [facility]. I would not want to start a muscle relaxer without having him checked out. I would make sure he is well hydrated. She can continue giving him bananas for snack. Green leafy vegetables and citrus fruits are also good sources of potassium. Avocado are great as well. There is a drink called body armour that has a lot of potassium if he likes those.

## 2020-01-21 NOTE — Telephone Encounter (Signed)
Spoke to mother, pt having some jerking (like restless leg).  This has been for the last 2-3 days, sporadic.  Not related to anything.  Every 30 minutes or so.  Not painful.  He takes MVI, she gave banana which hel states did help, pharmacist stated magnesium.  Only change is mother been ill, and pt has not ben as active as he normally is.  ? A muscle relaxant.

## 2020-01-21 NOTE — Telephone Encounter (Signed)
Pt mother called to advise pt leg has been cramping up and they are unsure if maybe there is a vitamin he could take to help

## 2020-01-21 NOTE — Telephone Encounter (Signed)
I called mother of pt and relayed per AL/NP recommendations of checking with pcp re: labs potassium level also with Dr. Doy Mince about magnesium  His recommendations.  She will call pcp to see about labs and cc to [place] results.  His diet has been one that is not that healthy, (soft diet).  She will let us know if needing anything else.

## 2020-02-18 ENCOUNTER — Other Ambulatory Visit: Payer: Self-pay | Admitting: Legal Medicine

## 2020-02-18 ENCOUNTER — Telehealth: Payer: Self-pay

## 2020-02-18 DIAGNOSIS — D229 Melanocytic nevi, unspecified: Secondary | ICD-10-CM

## 2020-02-18 NOTE — Telephone Encounter (Signed)
Patients mother called states patient needs a referral to dermatology for removal of spot on neck

## 2020-03-04 ENCOUNTER — Ambulatory Visit: Payer: Self-pay | Admitting: Legal Medicine

## 2020-03-28 DIAGNOSIS — G809 Cerebral palsy, unspecified: Secondary | ICD-10-CM | POA: Diagnosis not present

## 2020-03-28 DIAGNOSIS — Z79899 Other long term (current) drug therapy: Secondary | ICD-10-CM | POA: Diagnosis not present

## 2020-03-28 DIAGNOSIS — G40909 Epilepsy, unspecified, not intractable, without status epilepticus: Secondary | ICD-10-CM | POA: Diagnosis not present

## 2020-03-28 DIAGNOSIS — R519 Headache, unspecified: Secondary | ICD-10-CM | POA: Diagnosis not present

## 2020-03-30 ENCOUNTER — Telehealth: Payer: Self-pay | Admitting: Family Medicine

## 2020-03-30 DIAGNOSIS — G40301 Generalized idiopathic epilepsy and epileptic syndromes, not intractable, with status epilepticus: Secondary | ICD-10-CM

## 2020-03-30 NOTE — Telephone Encounter (Signed)
Spoke to mother, Dr. Brett Fairy to address.  UTI negative per mother.

## 2020-03-30 NOTE — Telephone Encounter (Signed)
I would like to defer this question to Dr Brett Fairy. He has a complicated history and intractable seizures.

## 2020-03-30 NOTE — Telephone Encounter (Signed)
Spoke to mother of pt.  He was seen last satruday at [facility] for intense prolonged seizure (lasted 35-40 min).  Family go pt inside care and drove him to hospital.  Valium nasal 20mg  dose spray did not work (one in each nostril).   EMS [place] 45 [place]  Labs done (ok).  Urinalysis to be done by pcp (husband to take today).  No change in his medications.  Lamotrigine 300mg  po bid, keppra 1500mg  po bid, vimpat 150mg  BID.  At Hospital they did increase to vimpat 200mg  po bid. No illness, no other changes.  Last  intense sz was last year in summer.  On Monday had headache, eyes deviated when out in car, drove back home gave valium which helped.  Mother asking what else can be done when has intense seizure.  Please advise. (suppository for valium she wondered if this would help if other did not).

## 2020-03-30 NOTE — Telephone Encounter (Signed)
Pt's mother called stating that the pt has had another seizure and she is wanting to discuss with RN or NP. Please advise.

## 2020-03-31 MED ORDER — VALTOCO 20 MG DOSE 10 MG/0.1ML NA LQPK: 1.0000 | NASAL | 3 refills | Status: DC | PRN

## 2020-03-31 MED ORDER — DIAZEPAM 2.5 MG RE GEL: 2.5000 mg | Freq: Once | RECTAL | Status: DC

## 2020-03-31 MED ORDER — LACOSAMIDE 200 MG PO TABS: 200.0000 mg | ORAL_TABLET | Freq: Two times a day (BID) | ORAL | 3 refills | Status: DC

## 2020-03-31 NOTE — Telephone Encounter (Signed)
The quickest absorption of benzodiaepine is midazolam and through nasal spray- there is no added benefit by using a rectal diazepam gel and it's much harder to apply. If the nasal spray did not work, we can use a rectal suppository to break a status . I am happy to write for the Diastat gel as a Plan B. Vimpat : increased dose to 200 mg is likely causing temporary drowsiness, sleepiness. Otherwise a good choice, (rather than a new drug to be introduced).

## 2020-03-31 NOTE — Addendum Note (Signed)
Addended by: Larey Seat on: 03/31/2020 12:36 PM   Modules accepted: Orders

## 2020-04-01 ENCOUNTER — Telehealth: Payer: Self-pay | Admitting: Family Medicine

## 2020-04-01 DIAGNOSIS — G808 Other cerebral palsy: Secondary | ICD-10-CM | POA: Diagnosis not present

## 2020-04-01 NOTE — Telephone Encounter (Signed)
Yes, nasal spray is easier to apply- give nasal spray 2 minutes to work, if not proceed to Diastat.  Vimpat increase should help reduce seizure frequency.

## 2020-04-01 NOTE — Telephone Encounter (Signed)
I called mother of pt. She wanted appt to discuss multiple questions, could not come in today at 1500.  They live so far out, and feels like the nasal spray did not work, suppository hard to do (if she is alone).  How long could he tolerate long intense sz?  Could medications be given at certain times that might help in the 6-9pm time frame he seems to have seizure. I have no availability with NP.  Please advise.  Pt is tolerating vimpat, (just sleeping a lot) I relayed that drowsiness SE.  Clarification on previous message :  Only nasal spray option, may use with suppository if nasal spray not work.  Would diastat gel be only other option besides what you wrote for?

## 2020-04-01 NOTE — Telephone Encounter (Signed)
Harry Carr  [place] called to report that a PA is needed on pt's diazePAM, 20 MG Dose, (VALTOCO 20 MG DOSE) 2 x 10 MG/0.1ML LQPK

## 2020-04-02 MED ORDER — DIAZEPAM 20 MG RE GEL: 20.0000 mg | Freq: Once | RECTAL | 0 refills | Status: DC | PRN

## 2020-04-02 NOTE — Telephone Encounter (Signed)
I LMVM for mother of pt.  I relayed per CD/MD that no other options other then nasal spray Diazepam (giving 2 miutes to work if not proceed givng the diastat).  She stated vimpat should help reduce seizure frequency.  I received that PA needed for the diazepam nasal spray will be worked on.  Medication schedule would not make difference. 04-21-20 at 0830 opening became available.  I scheduled this for you and Gerardo to address questions/concerns and per your request.  Please return call for questions/concerns.

## 2020-04-02 NOTE — Addendum Note (Signed)
Addended by: Larey Seat on: 04/02/2020 04:07 PM   Modules accepted: Orders

## 2020-04-02 NOTE — Telephone Encounter (Signed)
Completed PA on CMM and received instant approval.  This approval authorizes your coverage from 01/03/2020 - 04/02/2021  Pharmacy aware.

## 2020-04-02 NOTE — Addendum Note (Signed)
Addended by: Brandon Melnick on: 04/02/2020 10:32 AM   Modules accepted: Orders

## 2020-04-21 ENCOUNTER — Encounter: Payer: Self-pay | Admitting: Neurology

## 2020-04-21 ENCOUNTER — Ambulatory Visit (INDEPENDENT_AMBULATORY_CARE_PROVIDER_SITE_OTHER): Payer: Medicare Other | Admitting: Neurology

## 2020-04-21 VITALS — BP 110/75 | HR 57 | Ht 70.0 in | Wt 175.0 lb

## 2020-04-21 DIAGNOSIS — C751 Malignant neoplasm of pituitary gland: Secondary | ICD-10-CM

## 2020-04-21 DIAGNOSIS — G808 Other cerebral palsy: Secondary | ICD-10-CM

## 2020-04-21 DIAGNOSIS — G249 Dystonia, unspecified: Secondary | ICD-10-CM | POA: Diagnosis not present

## 2020-04-21 DIAGNOSIS — G40011 Localization-related (focal) (partial) idiopathic epilepsy and epileptic syndromes with seizures of localized onset, intractable, with status epilepticus: Secondary | ICD-10-CM

## 2020-04-21 MED ORDER — LACOSAMIDE 200 MG PO TABS: 200.0000 mg | ORAL_TABLET | Freq: Two times a day (BID) | ORAL | 3 refills | Status: DC

## 2020-04-21 MED ORDER — VALTOCO 20 MG DOSE 10 MG/0.1ML NA LQPK: 1.0000 | NASAL | 3 refills | Status: DC | PRN

## 2020-04-21 NOTE — Addendum Note (Signed)
Addended by: Larey Seat on: 04/21/2020 09:16 AM   Modules accepted: Orders
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
## 2020-04-21 NOTE — Patient Instructions (Signed)
Epilepsy Epilepsy is when a person keeps having seizures. A seizure is a burst of abnormal activity in the brain. A seizure can change how you think or behave, and it can make it hard to be aware of what is happening. This condition can cause problems such as:  Falls, accidents, and injury.  Sadness (depression).  Poor memory.  Sudden unexplained death in epilepsy (SUDEP). This is rare. Its cause is not known. Most people with epilepsy lead normal lives. What are the causes? This condition may be caused by:  A head injury.  An injury that happens at birth.  A high fever during childhood.  A stroke.  Bleeding that goes into or around the brain.  Certain medicines and drugs.  Having too little oxygen for a long period of time.  Abnormal brain development.  Certain infections.  Brain tumors.  Conditions that are passed from parent to child (are hereditary). What are the signs or symptoms? Symptoms of a seizure vary from person to person. They may include:  Jerky movements of muscles (convulsions).  Stiffening of the body.  Movements of the arms or legs that you are not able to control.  Passing out (loss of consciousness).  Breathing problems.  Sudden falls.  Confusion.  Head nodding.  Eye blinking or twitching.  Lip smacking.  Drooling.  Fast eye movements.  Grunting.  Not being able to control when you pee or poop.  Staring.  Being hard to wake up (unresponsiveness). Some people have symptoms right before a seizure happens (aura) and right after a seizure happens. These symptoms include:  Fear or anxiety.  Feeling sick to your stomach (nauseous).  Feeling like the room is spinning (vertigo).  A feeling of having seen or heard something before (dj vu).  Odd tastes or smells.  Changes in how you see (vision), such as seeing flashing lights or spots. Symptoms that follow a seizure include:  Being confused.  Being sleepy.  Having a  headache. How is this treated? Treatment can control seizures. Treatment for this condition may involve:  Taking medicines to control seizures.  Having a device (vagus nerve stimulator) put in the chest. The device sends signals to a nerve and to the brain to prevent seizures.  Brain surgery to stop seizures from happening or to reduce how often they happen.  Having blood tests often to make sure you are getting the right amount of medicine. Once this condition has been diagnosed, it is important to start treatment as soon as possible. For some people, epilepsy goes away in time. Others will need treatment for the rest of their life. Follow these instructions at home: Medicines  Take over-the-counter and prescription medicines only as told by your doctor.  Avoid anything that may keep your medicine from working, such as alcohol. Activity  Get enough rest. Lack of sleep can make seizures more likely to occur.  Follow your doctor's advice about driving, swimming, and doing anything else that would be dangerous if you had a seizure. ? If you live in the [place] check with your local DMV (department of motor vehicles) to find out about local driving laws. Each state has rules about when you can return to driving. Teaching others Teach friends and family what to do if you have a seizure. They should:  Lay you on the ground to prevent a fall.  Cushion your head and body.  Loosen any tight clothing around your neck.  Turn you on your side.  Stay with  you until you are better.  Not hold you down.  Not put anything in your mouth.  Know whether or not you need emergency care.  General instructions  Avoid anything that causes you to have seizures.  Keep a seizure diary. Write down what you remember about each seizure. Be sure to include what might have caused it.  Keep all follow-up visits as told by your doctor. This is important. Contact a doctor if:  You have a change in how  often or when you have seizures.  You get an infection or start to feel sick. You may have more seizures when you are sick. Get help right away if:  A seizure does not stop after 5 minutes.  You have more than one seizure in a row, and you do not have enough time between the seizures to feel better.  A seizure makes it harder to breathe.  A seizure is different from other seizures you have had.  A seizure makes you unable to speak or use a part of your body.  You did not wake up right after a seizure. These symptoms may be an emergency. Do not wait to see if the symptoms will go away. Get medical help right away. Call your local emergency services (911 in the [place] Do not drive yourself to the hospital. Summary  Epilepsy is when a person keeps having seizures. A seizure is a burst of abnormal activity in the brain.  Treatment can control seizures.  Teach friends and family what to do if you have a seizure. This information is not intended to replace advice given to you by your health care provider. Make sure you discuss any questions you have with your health care provider. Document Revised: 04/16/2018 Document Reviewed: 04/16/2018 Elsevier Patient Education  2020 [employer].

## 2020-05-05 ENCOUNTER — Other Ambulatory Visit: Payer: Self-pay | Admitting: Neurology

## 2020-05-05 DIAGNOSIS — G40209 Localization-related (focal) (partial) symptomatic epilepsy and epileptic syndromes with complex partial seizures, not intractable, without status epilepticus: Secondary | ICD-10-CM

## 2020-05-05 MED ORDER — LAMOTRIGINE 100 MG PO TABS: ORAL_TABLET | ORAL | 11 refills | Status: DC

## 2020-05-07 ENCOUNTER — Telehealth: Payer: Self-pay | Admitting: Neurology

## 2020-05-07 NOTE — Telephone Encounter (Signed)
Spoke with patients mother, we tried to get patient scheduled for 8/23 however, patient mother had other appt she needed to keep. Pt mother stated that she will give [place] a call back to schedule EMU for sometime in Sept.   Thanks,  Levada Dy

## 2020-05-08 ENCOUNTER — Other Ambulatory Visit (HOSPITAL_COMMUNITY): Payer: Medicare Other

## 2020-05-11 ENCOUNTER — Inpatient Hospital Stay (HOSPITAL_COMMUNITY): Admission: RE | Admit: 2020-05-11 | Payer: Medicare Other | Source: Ambulatory Visit | Admitting: Neurology

## 2020-05-18 ENCOUNTER — Other Ambulatory Visit: Payer: Self-pay

## 2020-05-18 MED ORDER — LEVETIRACETAM 500 MG PO TABS: 1500.0000 mg | ORAL_TABLET | Freq: Two times a day (BID) | ORAL | 11 refills | Status: DC

## 2020-06-05 ENCOUNTER — Other Ambulatory Visit (HOSPITAL_COMMUNITY)
Admission: RE | Admit: 2020-06-05 | Discharge: 2020-06-05 | Disposition: A | Discharge status: Home / Self Care | Payer: Medicare Other | Source: Ambulatory Visit | Attending: Neurology | Admitting: Neurology

## 2020-06-05 DIAGNOSIS — Z20822 Contact with and (suspected) exposure to covid-19: Secondary | ICD-10-CM | POA: Insufficient documentation

## 2020-06-05 DIAGNOSIS — Z01812 Encounter for preprocedural laboratory examination: Secondary | ICD-10-CM | POA: Insufficient documentation

## 2020-06-05 LAB — SARS CORONAVIRUS 2 (TAT 6-24 HRS): SARS Coronavirus 2: NEGATIVE

## 2020-06-08 ENCOUNTER — Other Ambulatory Visit: Payer: Self-pay

## 2020-06-08 ENCOUNTER — Inpatient Hospital Stay (HOSPITAL_COMMUNITY)
Admission: RE | Admit: 2020-06-08 | Discharge: 2020-06-10 | DRG: 100 | Disposition: A | Discharge status: Home / Self Care | Payer: Medicare Other | Source: Ambulatory Visit | Attending: Neurology | Admitting: Neurology

## 2020-06-08 ENCOUNTER — Encounter (HOSPITAL_COMMUNITY): Payer: Self-pay | Admitting: Neurology

## 2020-06-08 ENCOUNTER — Inpatient Hospital Stay (HOSPITAL_COMMUNITY): Payer: Medicare Other

## 2020-06-08 DIAGNOSIS — Z20822 Contact with and (suspected) exposure to covid-19: Secondary | ICD-10-CM | POA: Diagnosis present

## 2020-06-08 DIAGNOSIS — F70 Mild intellectual disabilities: Secondary | ICD-10-CM | POA: Diagnosis present

## 2020-06-08 DIAGNOSIS — Z79899 Other long term (current) drug therapy: Secondary | ICD-10-CM

## 2020-06-08 DIAGNOSIS — Z806 Family history of leukemia: Secondary | ICD-10-CM | POA: Diagnosis not present

## 2020-06-08 DIAGNOSIS — G40119 Localization-related (focal) (partial) symptomatic epilepsy and epileptic syndromes with simple partial seizures, intractable, without status epilepticus: Principal | ICD-10-CM | POA: Diagnosis present

## 2020-06-08 DIAGNOSIS — G800 Spastic quadriplegic cerebral palsy: Secondary | ICD-10-CM | POA: Diagnosis present

## 2020-06-08 DIAGNOSIS — R569 Unspecified convulsions: Secondary | ICD-10-CM | POA: Diagnosis not present

## 2020-06-08 DIAGNOSIS — I10 Essential (primary) hypertension: Secondary | ICD-10-CM | POA: Diagnosis present

## 2020-06-08 DIAGNOSIS — Z8249 Family history of ischemic heart disease and other diseases of the circulatory system: Secondary | ICD-10-CM | POA: Diagnosis not present

## 2020-06-08 DIAGNOSIS — G40209 Localization-related (focal) (partial) symptomatic epilepsy and epileptic syndromes with complex partial seizures, not intractable, without status epilepticus: Secondary | ICD-10-CM

## 2020-06-08 DIAGNOSIS — G40011 Localization-related (focal) (partial) idiopathic epilepsy and epileptic syndromes with seizures of localized onset, intractable, with status epilepticus: Secondary | ICD-10-CM | POA: Diagnosis not present

## 2020-06-08 LAB — COMPREHENSIVE METABOLIC PANEL
ALT: 23 U/L (ref 0–44)
AST: 34 U/L (ref 15–41)
Albumin: 4 g/dL (ref 3.5–5.0)
Alkaline Phosphatase: 110 U/L (ref 38–126)
Anion gap: 13 (ref 5–15)
BUN: 8 mg/dL (ref 6–20)
CO2: 22 mmol/L (ref 22–32)
Calcium: 9.6 mg/dL (ref 8.9–10.3)
Chloride: 104 mmol/L (ref 98–111)
Creatinine, Ser: 0.96 mg/dL (ref 0.61–1.24)
GFR calc Af Amer: >60 mL/min (ref >60)
GFR calc non Af Amer: >60 mL/min (ref >60)
Glucose, Bld: 123 mg/dL — ABNORMAL HIGH (ref 70–99)
Potassium: 3.8 mmol/L (ref 3.5–5.1)
Sodium: 139 mmol/L (ref 135–145)
Total Bilirubin: 1.2 mg/dL (ref 0.3–1.2)
Total Protein: 6.7 g/dL (ref 6.5–8.1)

## 2020-06-08 LAB — CBC WITH DIFFERENTIAL/PLATELET
Abs Immature Granulocytes: 0.02 10*3/uL (ref 0.00–0.07)
Basophils Absolute: 0 10*3/uL (ref 0.0–0.1)
Basophils Relative: 0 %
Eosinophils Absolute: 0.1 10*3/uL (ref 0.0–0.5)
Eosinophils Relative: 1 %
HCT: 43.9 % (ref 39.0–52.0)
Hemoglobin: 14.9 g/dL (ref 13.0–17.0)
Immature Granulocytes: 0 %
Lymphocytes Relative: 22 %
Lymphs Abs: 1.6 10*3/uL (ref 0.7–4.0)
MCH: 31 pg (ref 26.0–34.0)
MCHC: 33.9 g/dL (ref 30.0–36.0)
MCV: 91.3 fL (ref 80.0–100.0)
Monocytes Absolute: 0.4 10*3/uL (ref 0.1–1.0)
Monocytes Relative: 6 %
Neutro Abs: 4.9 10*3/uL (ref 1.7–7.7)
Neutrophils Relative %: 71 %
Platelets: 256 10*3/uL (ref 150–400)
RBC: 4.81 MIL/uL (ref 4.22–5.81)
RDW: 11.1 % — ABNORMAL LOW (ref 11.5–15.5)
WBC: 7 10*3/uL (ref 4.0–10.5)
nRBC: 0 % (ref 0.0–0.2)

## 2020-06-08 LAB — PROTIME-INR
INR: 1 (ref 0.8–1.2)
Prothrombin Time: 12.7 seconds (ref 11.4–15.2)

## 2020-06-08 LAB — MAGNESIUM: Magnesium: 2.1 mg/dL (ref 1.7–2.4)

## 2020-06-08 LAB — GLUCOSE, CAPILLARY: Glucose-Capillary: 99 mg/dL (ref 70–99)

## 2020-06-08 LAB — HIV ANTIBODY (ROUTINE TESTING W REFLEX): HIV Screen 4th Generation wRfx: NONREACTIVE

## 2020-06-08 LAB — PHOSPHORUS: Phosphorus: 3.6 mg/dL (ref 2.5–4.6)

## 2020-06-08 MED ORDER — SODIUM CHLORIDE 0.9% FLUSH
3.0000 mL | Freq: Two times a day (BID) | INTRAVENOUS | Status: DC
  Administered 2020-06-08 – 2020-06-10 (×4): 3 mL via INTRAVENOUS

## 2020-06-08 MED ORDER — ACETAMINOPHEN 650 MG RE SUPP: 650.0000 mg | RECTAL | Status: DC | PRN

## 2020-06-08 MED ORDER — ACETAMINOPHEN 325 MG PO TABS: 650.0000 mg | ORAL_TABLET | ORAL | Status: DC | PRN

## 2020-06-08 MED ORDER — LOSARTAN POTASSIUM 50 MG PO TABS
100.0000 mg | ORAL_TABLET | Freq: Every day | ORAL | Status: DC
  Administered 2020-06-08 – 2020-06-09 (×2): 100 mg via ORAL
  Filled 2020-06-08 (×2): qty 2

## 2020-06-08 MED ORDER — LACOSAMIDE 200 MG PO TABS
200.0000 mg | ORAL_TABLET | Freq: Two times a day (BID) | ORAL | Status: DC
  Administered 2020-06-08: 200 mg via ORAL
  Administered 2020-06-09: 100 mg via ORAL
  Filled 2020-06-08 (×2): qty 1

## 2020-06-08 MED ORDER — LORAZEPAM 2 MG/ML IJ SOLN
2.0000 mg | INTRAMUSCULAR | Status: DC | PRN
  Filled 2020-06-08: qty 1

## 2020-06-08 MED ORDER — LEVETIRACETAM 750 MG PO TABS
1500.0000 mg | ORAL_TABLET | Freq: Two times a day (BID) | ORAL | Status: DC
  Administered 2020-06-08: 1500 mg via ORAL
  Administered 2020-06-09: 750 mg via ORAL
  Filled 2020-06-08 (×2): qty 2

## 2020-06-08 MED ORDER — KETOROLAC TROMETHAMINE 15 MG/ML IJ SOLN
15.0000 mg | Freq: Once | INTRAMUSCULAR | Status: AC
  Administered 2020-06-08: 15 mg via INTRAVENOUS
  Filled 2020-06-08: qty 1

## 2020-06-08 MED ORDER — LAMOTRIGINE 100 MG PO TABS
300.0000 mg | ORAL_TABLET | Freq: Two times a day (BID) | ORAL | Status: DC
  Administered 2020-06-08: 300 mg via ORAL
  Administered 2020-06-09: 150 mg via ORAL
  Filled 2020-06-08 (×2): qty 3

## 2020-06-08 MED ORDER — ENOXAPARIN SODIUM 40 MG/0.4ML ~~LOC~~ SOLN
40.0000 mg | SUBCUTANEOUS | Status: DC
  Administered 2020-06-08: 40 mg via SUBCUTANEOUS
  Filled 2020-06-08 (×3): qty 0.4

## 2020-06-08 MED ORDER — LABETALOL HCL 5 MG/ML IV SOLN: 5.0000 mg | INTRAVENOUS | Status: DC | PRN

## 2020-06-08 NOTE — H&P (Signed)
CC: Seizure  History is obtained from: Patient's mother at bedside, chart review  HPI: Harry Carr is a 39 y.o. 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  Per patient's mother, patient was born 3 weeks earlier and had a 7-day NICU stay.  At about 9 months patient was unable to sit and on evaluation was diagnosed with cerebral palsy.  Patient mother denied any history of febrile seizures.  He had his first seizure when he was about 39 years old and since then continued to have seizures.  Per mother, during seizures patient left eye deviates outward, he makes grunting noises and is unable to speak but is sometimes able to understand.  She states these episodes used to happen once every few weeks but have recently increased in frequency and can happen almost daily.  Patient mom states in the last week patient has had one episode every day between 6:39 PM.  She also states that most of the time these episodes last for less than 2 minutes but twice in the past, most recently in July 2021, patient has prolonged episodes (episode in July lasted for about 45 minutes).  She states patient does not have any clear warning signs but at times patient is able to shake his head from side to side which prevents a seizure from happening.  He also reports a severe headache after seizure.  Patient's mom denies any history of generalized tonic-clonic seizures. Of note, patient's mother thinks that the episode in July could be precipitated by her forgetting to give him his a.m. dose of medications and states that he is very sensitive to his medications.  Therefore she tries to always give him his medications at night a.m. and 9 PM.  She also states that she has been giving him hemp twice daily but stopped before coming to the hospital.   Current AEDs: Vimpat 200 mg twice daily, lamotrigine 200 mg twice daily, Keppra 1500 mg twice  daily  Seizure risk factors: Positive NICU stay, denies febrile seizures, denies family history of epilepsy, denies meningitis/encephalitis, denies head injury with loss of consciousness.   ROS: All other systems reviewed and negative except as noted in the HPI.   Past Medical History:  Diagnosis Date  . Hypertension    mild  . Mild mental retardation   . Seizures ([place])   . Spastic quadriparesis secondary to cerebral palsy [facility])     Family History  Problem Relation Age of Onset  . Heart attack Father   . Heart disease Maternal Grandmother   . Cancer - Other Maternal Grandfather        lukemia   Social History:  reports that he has never smoked. He has never used smokeless tobacco. He reports that he does not use drugs. No history on file for alcohol use.  Exam: Current vital signs: BP (!) 148/93 (BP Location: Right Arm)   Pulse 87   Temp 97.7 F (36.5 C) (Oral)   Resp 20   Ht 5\' 10"  (1.778 m)   SpO2 96%   BMI 25.11 kg/m  Vital signs in last 24 hours: Temp:  [97.7 F (36.5 C)-98.4 F (36.9 C)] 97.7 F (36.5 C) (10/04 1210) Pulse Rate:  [74-87] 87 (10/04 1210) Resp:  [16-20] 20 (10/04 1210) BP: (142-148)/(85-93) 148/93 (10/04 1210) SpO2:  [96 %-100 %] 96 % (10/04 1210)   Physical Exam  Constitutional: Appears well-developed and well-nourished.  Psych: Affect appropriate  to situation Eyes: No scleral injection HENT: No OP obstrucion Head: Normocephalic.  Cardiovascular: Normal rate and regular rhythm.  Respiratory: Effort normal, non-labored breathing GI: Soft.  No distension. There is no tenderness.  Skin: Warm Neuro: AOx3, cranial nerves grossly intact, antigravity in all 4 extremities with spastic hemiparesis on left  I have reviewed labs in epic and the results pertinent to this consultation are: Blood glucose 123  I have reviewed the images obtained: No brain imaging available  ASSESSMENT/PLAN: 39 year old male with cerebral palsy and seizure-like  episodes admitted for characterization of spells.  Convulsions -We will obtain video EEG for characterization of spells -We will continue home dose of AEDs -If patient does not have any seizures today, will reduce morning dose of AEDs -Seizure precautions - As needed IV Ativan for clinical seizure-like activity  Hypertension -continue home losartan  I have spent a total of  75  minutes with the patient reviewing hospital notes,  test results, labs and examining the patient as well as establishing an assessment and plan that was discussed personally with the patient and his mother, RN > 50% of time was spent in direct patient care.    Zeb Comfort Epilepsy [facility]

## 2020-06-08 NOTE — Progress Notes (Signed)
Patients mother pressed seizure. Upon entering the room patient did not appear to be having a seizure. Mother stated that pt was having aura symptoms of making clicking noises with his mouth, but this did not progress into a seizure. Patient was able to answer questions and have a conversation with me at the time of assessment, he was fully oriented and not experiencing any seizure or aura symptoms. Neuro assessment was the same as patient's baseline. Mother was encouraged to press seizure button if the aura or any other seizure symptoms occur again.

## 2020-06-08 NOTE — Progress Notes (Signed)
EMU Admit vLTM EEG started. Tested event button

## 2020-06-08 NOTE — Progress Notes (Signed)
Patient mother pressed seizure button.  When RN entered the room, patients mother was standing beside him, stated that patient was about to have a seizure.  Patient was smacking his lips, patient was able to answer all questions and have a conversation.  Patient keep saying "I am going to shake it off".  Patient mother requested that patient be given his PM medications.  Patient was able to take all of his medication.  RN will continue to monitor patient.

## 2020-06-09 MED ORDER — LEVETIRACETAM 750 MG PO TABS: 750.0000 mg | ORAL_TABLET | Freq: Two times a day (BID) | ORAL | Status: DC

## 2020-06-09 MED ORDER — LAMOTRIGINE 100 MG PO TABS
300.0000 mg | ORAL_TABLET | Freq: Two times a day (BID) | ORAL | Status: DC
  Administered 2020-06-09 – 2020-06-10 (×2): 300 mg via ORAL
  Filled 2020-06-09 (×2): qty 3

## 2020-06-09 MED ORDER — LACOSAMIDE 200 MG PO TABS
200.0000 mg | ORAL_TABLET | Freq: Two times a day (BID) | ORAL | Status: DC
  Administered 2020-06-09 – 2020-06-10 (×2): 200 mg via ORAL
  Filled 2020-06-09 (×2): qty 1

## 2020-06-09 MED ORDER — LAMOTRIGINE 25 MG PO TABS: 150.0000 mg | ORAL_TABLET | Freq: Two times a day (BID) | ORAL | Status: DC

## 2020-06-09 MED ORDER — LEVETIRACETAM 750 MG PO TABS
1500.0000 mg | ORAL_TABLET | Freq: Two times a day (BID) | ORAL | Status: DC
  Administered 2020-06-09 – 2020-06-10 (×2): 1500 mg via ORAL
  Filled 2020-06-09 (×2): qty 2

## 2020-06-09 MED ORDER — LACOSAMIDE 50 MG PO TABS: 100.0000 mg | ORAL_TABLET | Freq: Two times a day (BID) | ORAL | Status: DC

## 2020-06-09 NOTE — Progress Notes (Signed)
EEG maintenance complete. No skin breakdown at [place] FP2 T6. Tested the event button Continue to monitor

## 2020-06-09 NOTE — Progress Notes (Signed)
Subjective: Had aura/warning prior to seizure yesterday but did not have a seizure.  ROS: negative except above  Examination  Vital signs in last 24 hours: Temp:  [97.3 F (36.3 C)-98.8 F (37.1 C)] 97.7 F (36.5 C) (10/05 1119) Pulse Rate:  [58-76] 72 (10/05 1119) Resp:  [13-20] 20 (10/05 1119) BP: (121-138)/(77-99) 121/92 (10/05 1119) SpO2:  [97 %-99 %] 98 % (10/05 1119) Weight:  [74.7 kg] 74.7 kg (10/04 1534)  General: lying in bed, not in apparent distress CVS: pulse-normal rate and rhythm RS: breathing comfortably, CTAB Extremities: normal, warm Neuro: AOx3, cranial nerves II to XII grossly intact, antigravity in all 4 extremities with spastic hemiparesis on left  Basic Metabolic Panel: Recent Labs  Lab 06/08/20 1401  NA 139  K 3.8  CL 104  CO2 22  GLUCOSE 123*  BUN 8  CREATININE 0.96  CALCIUM 9.6  MG 2.1  PHOS 3.6    CBC: Recent Labs  Lab 06/08/20 1401  WBC 7.0  NEUTROABS 4.9  HGB 14.9  HCT 43.9  MCV 91.3  PLT 256     Coagulation Studies: Recent Labs    06/08/20 1401  LABPROT 12.7  INR 1.0    Imaging No brain imaging overnight  ASSESSMENT AND PLAN: 39 year old male with cerebral palsy and seizure-like episodes admitted for characterization of spells.  Convulsions -Continue video EEG for characterization of spells -We will reduce home dose of AEDs into half -Discussed presence of bitemporal sharp waves with patient's mother.  She inquired about vagal nerve stimulator and requested referral to neurosurgeon. -Seizure precautions - As needed IV Ativan for clinical seizure-like activity  Hypertension -continue home losartan  I have spent a total of 35  minuteswith the patient reviewing hospitalnotes,  test results, labs and examining the patient as well as establishing an assessment and plan that was discussed personally with the patient and his mother, RN>50% of time was spent in direct patient care.    Zeb Comfort Epilepsy [facility] For questions after 5pm please refer to AMION to reach the Neurologist on call

## 2020-06-09 NOTE — Progress Notes (Signed)
EEG Photic and HV complete. Offers no complaints at this time

## 2020-06-09 NOTE — Progress Notes (Signed)
Patient had seizure at 6:50 pm that lasted approximately 1 minute. The patient's mother said he was making clicking noises with his mouth and his eyes deviated to the left then he "zoned out" and got goosebumps.  Dr. Hortense Ramal notified, said she would review EEG and notify nurse of the plan for the night.

## 2020-06-09 NOTE — Procedures (Addendum)
Patient Name: Harry Carr  MRN: [id number]  Epilepsy Attending: Lora Havens  Referring Physician/Provider: Dr. Zeb Comfort Duration: 06/08/2020 8338 to 06/09/2020 0951  Patient history: 39 year old male with history of epilepsy.  EEG to evaluate for seizures and characterization of spells.  Level of alertness: Awake, asleep  AEDs during EEG study: Keppra, Vimpat, lamotrigine  Technical aspects: This EEG study was done with scalp electrodes positioned according to the 10-20 International system of electrode placement. Electrical activity was acquired at a sampling rate of 500Hz  and reviewed with a high frequency filter of 70Hz  and a low frequency filter of 1Hz . EEG data were recorded continuously and digitally stored.   Description: The posterior dominant rhythm consists of 8.5 Hz activity of moderate voltage (25-35 uV) seen predominantly in posterior head regions, symmetric and reactive to eye opening and eye closing. Sleep was characterized by vertex waves, sleep spindles (12 to 14 Hz), maximal frontocentral region.  EEG showed bilateral left more than right independent sharp waves, seen more frequently in sleep.  Frontotemporal hyperventilation and photic stimulation were not performed.     Event button was pressed on 06/08/2020 at 1403.  On video patient was noted to have grunting noises and was licking his lips.  Patient's mother states this is similar to his warning signs but did not progress to his actual seizure.  Concomitant EEG before, during and after the event did not show any EEG change to suggest seizure.   ABNORMALITY -Sharp waves, left more than right frontotemporal region  IMPRESSION: This study is showed evidence of epileptogenic city arising from left more than right frontotemporal region. No seizures were seen throughout the recording.  One event was captured on 06/08/2020 as described above without concomitant EEG change.  However, this was possibly patient's aura  which may not have concomitant EEG change on scalp EEG.        Ariahna Smiddy Barbra Sarks

## 2020-06-10 ENCOUNTER — Encounter (HOSPITAL_COMMUNITY): Payer: Self-pay | Admitting: Neurology

## 2020-06-10 DIAGNOSIS — G40011 Localization-related (focal) (partial) idiopathic epilepsy and epileptic syndromes with seizures of localized onset, intractable, with status epilepticus: Secondary | ICD-10-CM

## 2020-06-10 NOTE — Progress Notes (Signed)
vLTM EEG complete. No skin breakdown 

## 2020-06-10 NOTE — Progress Notes (Signed)
Discharged to home after IV access removed and discharge instructions reviewed with pt and mom.  All questions answered.  Meds picked up by SWOT nurse from pharmacy and returned back to him.

## 2020-06-10 NOTE — Progress Notes (Signed)
Pt had a quiet night rest, no seizure activity observed, family and pt reassured. Obasogie-Asidi, Brodan Grewell Efe

## 2020-06-10 NOTE — Discharge Instructions (Addendum)
Parris Cudworth was admitted to epilepsy monitoring unit for characterization of spells. EEG showed evidence of epileptogenicity arising from left more than right frontotemporal region.  One seizure was recorded on 06/09/2020 during which patient was noted to have decreased responsiveness arising from left frontotemporal region.  The EEG findings were discussed with patient's mother at bedside.  Patient has not had an MRI brain, therefore will order an outpatient MRI brain without contrast.  We discussed the options for further work-up and referral to tertiary care center for epilepsy surgery.  At this point, patient's mother would like to proceed with vagal nerve stimulator.  Referral was made to Dr. Cleotilde Neer office.  Of note, patient's mother also discussed using hemp for Cadarius's seizures.  I discussed Epidiolex instead of hemp.  We will discuss the co-pay with our case manager and let Dr. Brett Fairy know.  At this point, we will continue patient on home dose of AEDs.  Seizure precautions discussed.

## 2020-06-10 NOTE — Discharge Summary (Addendum)
Physician Discharge Summary  Patient ID: Harry Carr MRN: [id number] DOB/AGE: 1981-06-13 39 y.o.  Admit date: 06/08/2020 Discharge date: 06/10/2020  Admission Diagnoses: Convulsions  Discharge Diagnoses: Focal epilepsy, medically refractory, independent bilateral frontotemporal region  Discharged Condition: stable  Hospital Course: Jasmon Graffam is a 39 year old male with history of epilepsy who was admitted for characterization of spells.  During this hospitalization, patient's AEDs were reduced to half of the form dose.  Patient was noted to have one seizure during which he had alteration of awareness, not following commands on 06/09/2020.  Concomitant EEG showed low amplitude rhythmic 2 to 3 Hz delta slowing in left frontotemporal region.  Bilateral independent left more than right sharp waves were also seen in frontotemporal region.  At the time of discharge, patient was resumed on home dose of AEDs.  Patient's mother discussed using hemp for patient's seizures.  We will request our case manager to check the co-pay for Epidiolex and communicate with Dr. Edwena Felty office regarding possibly starting patient on Epidiolex.  Also discussed pursuing surgical work-up for patient's epilepsy.  At this point, patient's mother would not like to pursue invasive surgical work-up.  However she is interested in vagal nerve stimulation.  Referral was made to neurosurgery Dr. Kathyrn Sheriff for VNS placement.  Ordered MRI brain without contrast as an outpatient.   Consults: None  Significant Diagnostic Studies: EEG: This study showed evidence of epileptogenicity arising from left more than right frontotemporal regions.  One seizure was captured on 06/09/2020 during which patient was noted to have decreased responsiveness.  Concomitant EEG showed low amplitude 2 to 3 Hz rhythmic delta slowing in left frontotemporal region.    Treatments: Continue home AEDs  Discharge Exam: Blood pressure 110/76, pulse 69,  temperature 98.5 F (36.9 C), temperature source Oral, resp. rate 15, height 5\' 10"  (1.778 m), weight 74.7 kg, SpO2 98 %.  General: lying in bed, not in apparent distress CVS: pulse-normal rate and rhythm RS: breathing comfortably, CTAB Extremities: normal, warm Neuro: AOx3, cranial nerves II to XII grossly intact, antigravity in all 4 extremities with spastic hemiparesis on left  Disposition: Home   Allergies as of 06/10/2020   No Known Allergies     Medication List    STOP taking these medications   butalbital-acetaminophen-caffeine 50-325-40 MG tablet Commonly known as: FIORICET     TAKE these medications   aspirin 81 MG tablet Take 81 mg by mouth daily.   diazepam 20 MG Gel Commonly known as: DIASAT Place 20 mg rectally once as needed for up to 1 dose.   Valtoco 20 MG Dose 2 x 10 MG/0.1ML Lqpk Generic drug: diazePAM (20 MG Dose) Place 1 spray into the nose as needed (each nostril 1 spray in status epilepticus).   lacosamide 200 MG Tabs tablet Commonly known as: VIMPAT Take 1 tablet (200 mg total) by mouth 2 (two) times daily.   lamoTRIgine 100 MG tablet Commonly known as: LAMICTAL BRAND NAME ONLY, 3 tabs 2 times a day What changed:   how much to take  how to take this  when to take this   levETIRAcetam 500 MG tablet Commonly known as: KEPPRA Take 3 tablets (1,500 mg total) by mouth 2 (two) times daily. BRAND NAME ONLY   losartan 100 MG tablet Commonly known as: COZAAR Take 1 tablet (100 mg total) by mouth daily.         I have spent a total of  45  minutes with the patient reviewing hospital notes,  test  results, labs and examining the patient as well as establishing an assessment and plan that was discussed personally with the patient's mother.  > 50% of time was spent in direct patient care.     Signed: Lora Havens 06/10/2020, 10:38 AM

## 2020-06-10 NOTE — Procedures (Addendum)
Patient Name: Harry Carr  MRN: [id number]  Epilepsy Attending: Lora Havens  Referring Physician/Provider: Dr. Zeb Comfort Duration: 06/09/2020 5056 to 06/10/2020 1153  Patient history: 39 year old male with history of epilepsy.  EEG to evaluate for seizures and characterization of spells.  Level of alertness: Awake, asleep  AEDs during EEG study: Keppra, Vimpat, lamotrigine  Technical aspects: This EEG study was done with scalp electrodes positioned according to the 10-20 International system of electrode placement. Electrical activity was acquired at a sampling rate of 500Hz  and reviewed with a high frequency filter of 70Hz  and a low frequency filter of 1Hz . EEG data were recorded continuously and digitally stored.   Description: The posterior dominant rhythm consists of 8.5 Hz activity of moderate voltage (25-35 uV) seen predominantly in posterior head regions, symmetric and reactive to eye opening and eye closing. Sleep was characterized by vertex waves, sleep spindles (12 to 14 Hz), maximal frontocentral region.  EEG showed bilateral left more than right independent sharp waves, seen more frequently in sleep.  Photic driving was not seen during photic stimulation.  No EEG change was seen during hyperventilation.  Event button was pressed on 06/09/2020 at 1844.  On video patient was noted to make grunting noises and was licking his lips.  According to patient's mother he appeared to be zoning out.   On video he appeared to be looking down, bilateral upper extremity flexed and held close to his chest.  After about a minute patient started mumbling but was difficult to understand what he was saying. After the end of the episode at about 2 minutes, patient appeared to be relaxed and did not remember the episode.  Concomitant EEG initially showed 2 to 3 Hz rhythmic delta slowing in left frontotemporal region which gradually evolved into 45 Hz delta slowing and involved the right side and  about 20 seconds.     ABNORMALITY -Focal seizure with alteration of awareness, left frontotemporal region -Sharp waves, left more than right frontotemporal region  IMPRESSION: This study showed 1 seizure with alteration of awareness arising from left frontotemporal region as described above.  There was also evidence of epileptogenicity arising from left more than right frontotemporal region.   Bilateral independent frontotemporal sharp waves    Seizure onset    +15 seconds    +30 seconds   end

## 2020-06-10 NOTE — TOC Transition Note (Signed)
Transition of Care [facility]) - CM/SW Discharge Note   Patient Details  Name: Harry Carr MRN: [id number] Date of Birth: [birth year]  [facility] CM/SW Contact:  Pollie Friar, RN Phone Number: 06/10/2020, 12:09 PM   Clinical Narrative:    Pt discharging home with mother. CM consulted for benefits check on Epidiolex 150 mg BID. CM called pts insurance and it is not covered. Medication will require a prior auth. CM has submitted this but may not hear back for 72 hours. CM has updated Dr Hortense Ramal. Mother to provide transport home.   Final next level of care: Home/Self Care Barriers to Discharge: No Barriers Identified   Patient Goals and CMS Choice        Discharge Placement                       Discharge Plan and Services                                     Social Determinants of Health (SDOH) Interventions     Readmission Risk Interventions No flowsheet data found.

## 2020-06-11 ENCOUNTER — Telehealth: Payer: Self-pay | Admitting: Neurology

## 2020-06-11 NOTE — Telephone Encounter (Signed)
Admit date: 06/08/2020 Discharge date: 06/10/2020  Admission Diagnoses: Convulsions  Discharge Diagnoses: Focal epilepsy, medically refractory, independent bilateral frontotemporal region  Discharged Condition: stable  Hospital Course: Harry Carr is a 39 year old male with history of epilepsy who was admitted for characterization of spells.  During this hospitalization, patient's AEDs were reduced to half of the form dose.  Patient was noted to have one seizure during which he had alteration of awareness, not following commands on 06/09/2020.  Concomitant EEG showed low amplitude rhythmic 2 to 3 Hz delta slowing in left frontotemporal region.  Bilateral independent left more than right sharp waves were also seen in frontotemporal region.  At the time of discharge, patient was resumed on home dose of AEDs.  Patient's mother discussed using hemp for patient's seizures.   We will request our case manager to check the co-pay for Epidiolex and communicate with Dr. Edwena Felty office regarding possibly starting patient on Epidiolex.   Also discussed, pursuing surgical work-up for patient's epilepsy.  At this point, patient's mother would not like to pursue invasive surgical work-up.  However she is interested in vagal nerve stimulation.   Referral was made to neurosurgery Dr. Kathyrn Sheriff for VNS placement.   Ordered MRI brain without contrast as an outpatient.   Consults: None  Significant Diagnostic Studies: EEG: This study showed evidence of epileptogenicity arising from left more than right frontotemporal regions.  One seizure was captured on 06/09/2020 during which patient was noted to have decreased responsiveness.  Concomitant EEG showed low amplitude 2 to 3 Hz rhythmic delta slowing in left frontotemporal region.    Treatments: Continue home AEDs.

## 2020-06-29 DIAGNOSIS — G40219 Localization-related (focal) (partial) symptomatic epilepsy and epileptic syndromes with complex partial seizures, intractable, without status epilepticus: Secondary | ICD-10-CM | POA: Diagnosis not present

## 2020-07-01 DIAGNOSIS — G40219 Localization-related (focal) (partial) symptomatic epilepsy and epileptic syndromes with complex partial seizures, intractable, without status epilepticus: Secondary | ICD-10-CM | POA: Insufficient documentation

## 2020-07-02 ENCOUNTER — Other Ambulatory Visit: Payer: Self-pay | Admitting: Neurosurgery

## 2020-07-03 ENCOUNTER — Other Ambulatory Visit: Payer: Self-pay | Admitting: Neurosurgery

## 2020-07-03 DIAGNOSIS — G808 Other cerebral palsy: Secondary | ICD-10-CM | POA: Diagnosis not present

## 2020-07-06 ENCOUNTER — Other Ambulatory Visit: Payer: Self-pay

## 2020-07-06 MED ORDER — LOSARTAN POTASSIUM 100 MG PO TABS
100.0000 mg | ORAL_TABLET | Freq: Every day | ORAL | 0 refills | Status: DC
Start: 2020-07-06 — End: 2020-08-19

## 2020-07-10 ENCOUNTER — Other Ambulatory Visit (HOSPITAL_COMMUNITY): Payer: Medicare Other

## 2020-07-13 ENCOUNTER — Other Ambulatory Visit: Payer: Self-pay

## 2020-07-13 ENCOUNTER — Encounter (HOSPITAL_COMMUNITY): Payer: Self-pay | Admitting: Neurosurgery

## 2020-07-13 NOTE — Progress Notes (Signed)
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 He has known seizures but with recent increased frequency. He was admitted 06/08/20-06/10/20 for video EEG for characterization of spells (see below). Referred to neurosurgery for consideration of VNS.   History includes never smoker, cerebral palsy with spastic quadriparesis, seizures, mild intellectual disability, hypertension, dorsal rhizotomy (age 62 years old), wisdom teeth extractions (03/2010).  He is for same-day Covid testing. Anesthesia team to evaluate on the day of surgery.    VS:  BP Readings from Last 3 Encounters:  06/10/20 110/76  04/21/20 110/75  11/05/19 119/71   Pulse Readings from Last 3 Encounters:  06/10/20 69  04/21/20 (!) 57  11/05/19 63    PROVIDERS: Lillard Anes, MD is listed as PCP  - Dohmeier, Asencion Partridge, MD is primary neurologist - Elvia Collum, MD is neurologist ([place]) for Botox injections. S/p EMG guided botulinum toxin injection to the left upper extremity for treatment of his spastic hemiplegic cerebral palsy.   LABS: Most recent lab results include: Lab Results  Component Value Date   WBC 7.0 06/08/2020   HGB 14.9 06/08/2020   HCT 43.9 06/08/2020   PLT 256 06/08/2020   GLUCOSE 123 (H) 06/08/2020   ALT 23 06/08/2020   AST 34 06/08/2020   NA 139 06/08/2020   K 3.8 06/08/2020   CL 104 06/08/2020   CREATININE 0.96 06/08/2020   BUN 8 06/08/2020   CO2 22 06/08/2020   INR 1.0 06/08/2020     OTHER: Video EEG 06/08/20-06/09/20: IMPRESSION: - This study is showed evidence of  epileptogenic city arising from left more than right frontotemporal region. No seizures were seen throughout the recording. - One event was captured on 06/08/2020 as described above without concomitant EEG change.  However, this was possibly patient's aura which may not have concomitant EEG change on scalp EEG.     EKG: 06/08/20: Normal sinus rhythm Incomplete right bundle branch block Nonspecific ST abnormality Abnormal ECG No old tracing to compare Confirmed by Mertie Moores [phone]) on 06/08/2020 6:48:28 PM   CV: N/A  Past Medical History:  Diagnosis Date  . Hypertension    mild  . Mild mental retardation   . Seizures ([place])   . 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 dorsal rhizotomy  age 89 yrs  . WISDOM TOOTH EXTRACTION  03/2010    MEDICATIONS: . diazepam (DIASTAT) rectal kit 2.5 mg   . aspirin 81 MG tablet  . diazepam (DIASAT) 20 MG GEL  . diazePAM, 20 MG Dose, (VALTOCO 20 MG DOSE) 2 x 10 MG/0.1ML LQPK  . lacosamide (VIMPAT) 200 MG TABS tablet  . lamoTRIgine (LAMICTAL) 100 MG tablet  . levETIRAcetam (KEPPRA) 500 MG tablet  . losartan (COZAAR) 100 MG tablet  . Multiple Vitamins-Minerals (MULTIVITAMIN WITH MINERALS) tablet  . vitamin E 45 MG (100 UNITS) capsule    Myra Gianotti, PA-C Surgical Short Stay/Anesthesiology [facility] Phone [phone] [facility] Phone [phone] 07/13/2020 3:54 PM

## 2020-07-13 NOTE — Progress Notes (Signed)
Spoke with pt's mother, [name] for pre-op call. Pt has cerebral palsy, slight mental retardation ( per chart) and mom has to be with him up until he goes into surgery and just after he goes into recovery. Pt is very anxious without his mother with him and will start to cry if she is not there. Pt has hx of seizures and is treated for HTN. Mother will bring his seizure meds with her because she wants to either give them to him just before he goes into surgery or just as soon as he is awake in recovery.   Covid test will need to be done on arrival. She states that the last Covid test that was done it caused him to have a seizure.

## 2020-07-13 NOTE — Anesthesia Preprocedure Evaluation (Addendum)
Anesthesia Evaluation  Patient identified by MRN, date of birth, ID band Patient awake    Reviewed: Allergy & Precautions, NPO status , Patient's Chart, lab work & pertinent test results  Airway Mallampati: II  TM Distance: >3 FB     Dental   Pulmonary    breath sounds clear to auscultation       Cardiovascular hypertension,  Rhythm:Regular Rate:Normal     Neuro/Psych  Headaches, Seizures -,  PSYCHIATRIC DISORDERS    GI/Hepatic negative GI ROS, Neg liver ROS,   Endo/Other  negative endocrine ROS  Renal/GU negative Renal ROS     Musculoskeletal   Abdominal   Peds  Hematology   Anesthesia Other Findings   Reproductive/Obstetrics                            Anesthesia Physical Anesthesia Plan  ASA: III  Anesthesia Plan: General   Post-op Pain Management:    Induction: Intravenous  PONV Risk Score and Plan: 2 and Ondansetron, Dexamethasone and Midazolam  Airway Management Planned: Oral ETT  Additional Equipment:   Intra-op Plan:   Post-operative Plan: Extubation in OR  Informed Consent: I have reviewed the patients History and Physical, chart, labs and discussed the procedure including the risks, benefits and alternatives for the proposed anesthesia with the patient or authorized representative who has indicated his/her understanding and acceptance.     Dental advisory given  Plan Discussed with: Anesthesiologist and CRNA  Anesthesia Plan Comments: (PAT note written 07/13/2020 by Myra Gianotti, PA-C. )       Anesthesia Quick Evaluation

## 2020-07-14 ENCOUNTER — Ambulatory Visit (HOSPITAL_COMMUNITY): Payer: Medicare Other | Admitting: Vascular Surgery

## 2020-07-14 ENCOUNTER — Encounter (HOSPITAL_COMMUNITY)
Admission: RE | Disposition: A | Discharge status: Home / Self Care | Payer: Self-pay | Source: Home / Self Care | Attending: Neurosurgery

## 2020-07-14 ENCOUNTER — Encounter (HOSPITAL_COMMUNITY): Payer: Self-pay | Admitting: Neurosurgery

## 2020-07-14 ENCOUNTER — Ambulatory Visit (HOSPITAL_COMMUNITY): Payer: Medicare Other

## 2020-07-14 ENCOUNTER — Ambulatory Visit (HOSPITAL_COMMUNITY)
Admission: RE | Admit: 2020-07-14 | Discharge: 2020-07-14 | Disposition: A | Discharge status: Home / Self Care | Payer: Medicare Other | Attending: Neurosurgery | Admitting: Neurosurgery

## 2020-07-14 DIAGNOSIS — G800 Spastic quadriplegic cerebral palsy: Secondary | ICD-10-CM | POA: Insufficient documentation

## 2020-07-14 DIAGNOSIS — G40219 Localization-related (focal) (partial) symptomatic epilepsy and epileptic syndromes with complex partial seizures, intractable, without status epilepticus: Secondary | ICD-10-CM | POA: Diagnosis not present

## 2020-07-14 DIAGNOSIS — Z7982 Long term (current) use of aspirin: Secondary | ICD-10-CM | POA: Diagnosis not present

## 2020-07-14 DIAGNOSIS — Z79899 Other long term (current) drug therapy: Secondary | ICD-10-CM | POA: Insufficient documentation

## 2020-07-14 DIAGNOSIS — G40919 Epilepsy, unspecified, intractable, without status epilepticus: Secondary | ICD-10-CM | POA: Diagnosis not present

## 2020-07-14 DIAGNOSIS — I10 Essential (primary) hypertension: Secondary | ICD-10-CM | POA: Diagnosis not present

## 2020-07-14 DIAGNOSIS — Z20822 Contact with and (suspected) exposure to covid-19: Secondary | ICD-10-CM | POA: Diagnosis not present

## 2020-07-14 DIAGNOSIS — G40211 Localization-related (focal) (partial) symptomatic epilepsy and epileptic syndromes with complex partial seizures, intractable, with status epilepticus: Secondary | ICD-10-CM | POA: Diagnosis not present

## 2020-07-14 DIAGNOSIS — G8250 Quadriplegia, unspecified: Secondary | ICD-10-CM | POA: Diagnosis not present

## 2020-07-14 DIAGNOSIS — T17908A Unspecified foreign body in respiratory tract, part unspecified causing other injury, initial encounter: Secondary | ICD-10-CM

## 2020-07-14 DIAGNOSIS — J9811 Atelectasis: Secondary | ICD-10-CM | POA: Diagnosis not present

## 2020-07-14 HISTORY — PX: VAGUS NERVE STIMULATOR INSERTION: SHX348

## 2020-07-14 HISTORY — DX: Headache, unspecified: R51.9

## 2020-07-14 LAB — BASIC METABOLIC PANEL
Anion gap: 9 (ref 5–15)
BUN: 8 mg/dL (ref 6–20)
CO2: 28 mmol/L (ref 22–32)
Calcium: 9.5 mg/dL (ref 8.9–10.3)
Chloride: 103 mmol/L (ref 98–111)
Creatinine, Ser: 0.94 mg/dL (ref 0.61–1.24)
GFR, Estimated: >60 mL/min (ref >60)
Glucose, Bld: 99 mg/dL (ref 70–99)
Potassium: 3.9 mmol/L (ref 3.5–5.1)
Sodium: 140 mmol/L (ref 135–145)

## 2020-07-14 LAB — SARS CORONAVIRUS 2 BY RT PCR (HOSPITAL ORDER, PERFORMED IN ~~LOC~~ HOSPITAL LAB): SARS Coronavirus 2: NEGATIVE

## 2020-07-14 SURGERY — VAGAL NERVE STIMULATOR IMPLANT
Anesthesia: General | Site: Chest | Laterality: Left

## 2020-07-14 MED ORDER — HYDROCODONE-ACETAMINOPHEN 5-325 MG PO TABS: 1.0000 | ORAL_TABLET | Freq: Three times a day (TID) | ORAL | 0 refills | Status: DC | PRN

## 2020-07-14 MED ORDER — FENTANYL CITRATE (PF) 250 MCG/5ML IJ SOLN
INTRAMUSCULAR | Status: AC
  Filled 2020-07-14: qty 5

## 2020-07-14 MED ORDER — 0.9 % SODIUM CHLORIDE (POUR BTL) OPTIME
TOPICAL | Status: DC | PRN
  Administered 2020-07-14: 1000 mL

## 2020-07-14 MED ORDER — CHLORHEXIDINE GLUCONATE 0.12 % MT SOLN
15.0000 mL | Freq: Once | OROMUCOSAL | Status: DC
  Filled 2020-07-14: qty 15

## 2020-07-14 MED ORDER — CHLORHEXIDINE GLUCONATE CLOTH 2 % EX PADS: 6.0000 | MEDICATED_PAD | Freq: Once | CUTANEOUS | Status: DC

## 2020-07-14 MED ORDER — ORAL CARE MOUTH RINSE: 15.0000 mL | Freq: Once | OROMUCOSAL | Status: DC

## 2020-07-14 MED ORDER — CEFAZOLIN SODIUM-DEXTROSE 2-4 GM/100ML-% IV SOLN
2.0000 g | INTRAVENOUS | Status: AC
  Administered 2020-07-14: 2 g via INTRAVENOUS
  Filled 2020-07-14: qty 100

## 2020-07-14 MED ORDER — MIDAZOLAM HCL 2 MG/2ML IJ SOLN
INTRAMUSCULAR | Status: AC
  Filled 2020-07-14: qty 2

## 2020-07-14 MED ORDER — LIDOCAINE-EPINEPHRINE 1 %-1:100000 IJ SOLN
INTRAMUSCULAR | Status: DC | PRN
  Administered 2020-07-14: 3.5 mL

## 2020-07-14 MED ORDER — ONDANSETRON HCL 4 MG/2ML IJ SOLN
INTRAMUSCULAR | Status: AC
  Filled 2020-07-14: qty 2

## 2020-07-14 MED ORDER — PROPOFOL 10 MG/ML IV BOLUS
INTRAVENOUS | Status: AC
  Filled 2020-07-14: qty 20

## 2020-07-14 MED ORDER — ONDANSETRON HCL 4 MG/2ML IJ SOLN
4.0000 mg | Freq: Once | INTRAMUSCULAR | Status: AC
  Administered 2020-07-14: 4 mg via INTRAVENOUS

## 2020-07-14 MED ORDER — THROMBIN 5000 UNITS EX SOLR
OROMUCOSAL | Status: DC | PRN
  Administered 2020-07-14: 5 mL via TOPICAL

## 2020-07-14 MED ORDER — FENTANYL CITRATE (PF) 100 MCG/2ML IJ SOLN: 25.0000 ug | INTRAMUSCULAR | Status: DC | PRN

## 2020-07-14 MED ORDER — THROMBIN 5000 UNITS EX SOLR
CUTANEOUS | Status: AC
  Filled 2020-07-14: qty 5000

## 2020-07-14 MED ORDER — BUPIVACAINE HCL (PF) 0.5 % IJ SOLN
INTRAMUSCULAR | Status: AC
  Filled 2020-07-14: qty 30

## 2020-07-14 MED ORDER — FENTANYL CITRATE (PF) 250 MCG/5ML IJ SOLN
INTRAMUSCULAR | Status: DC | PRN
  Administered 2020-07-14 (×2): 50 ug via INTRAVENOUS
  Administered 2020-07-14: 25 ug via INTRAVENOUS
  Administered 2020-07-14: 50 ug via INTRAVENOUS
  Administered 2020-07-14: 25 ug via INTRAVENOUS

## 2020-07-14 MED ORDER — SUGAMMADEX SODIUM 200 MG/2ML IV SOLN
INTRAVENOUS | Status: DC | PRN
  Administered 2020-07-14: 200 mg via INTRAVENOUS

## 2020-07-14 MED ORDER — MIDAZOLAM HCL 2 MG/2ML IJ SOLN
INTRAMUSCULAR | Status: DC | PRN
  Administered 2020-07-14 (×2): 1 mg via INTRAVENOUS

## 2020-07-14 MED ORDER — GLYCOPYRROLATE 0.2 MG/ML IJ SOLN
INTRAMUSCULAR | Status: DC | PRN
  Administered 2020-07-14 (×2): .1 mg via INTRAVENOUS

## 2020-07-14 MED ORDER — LACTATED RINGERS IV SOLN: INTRAVENOUS | Status: DC

## 2020-07-14 MED ORDER — DEXAMETHASONE SODIUM PHOSPHATE 10 MG/ML IJ SOLN
INTRAMUSCULAR | Status: DC | PRN
  Administered 2020-07-14: 10 mg via INTRAVENOUS

## 2020-07-14 MED ORDER — PROPOFOL 10 MG/ML IV BOLUS
INTRAVENOUS | Status: DC | PRN
  Administered 2020-07-14: 20 mg via INTRAVENOUS
  Administered 2020-07-14: 200 mg via INTRAVENOUS

## 2020-07-14 MED ORDER — BUPIVACAINE HCL 0.5 % IJ SOLN
INTRAMUSCULAR | Status: DC | PRN
  Administered 2020-07-14: 3.5 mL

## 2020-07-14 MED ORDER — ONDANSETRON HCL 4 MG/2ML IJ SOLN
INTRAMUSCULAR | Status: DC | PRN
  Administered 2020-07-14: 4 mg via INTRAVENOUS

## 2020-07-14 MED ORDER — LIDOCAINE-EPINEPHRINE 1 %-1:100000 IJ SOLN
INTRAMUSCULAR | Status: AC
  Filled 2020-07-14: qty 1

## 2020-07-14 MED ORDER — PHENYLEPHRINE HCL (PRESSORS) 10 MG/ML IV SOLN
INTRAVENOUS | Status: DC | PRN
  Administered 2020-07-14: 40 ug via INTRAVENOUS

## 2020-07-14 MED ORDER — PHENYLEPHRINE HCL-NACL 10-0.9 MG/250ML-% IV SOLN
INTRAVENOUS | Status: DC | PRN
  Administered 2020-07-14: 15 ug/min via INTRAVENOUS

## 2020-07-14 MED ORDER — ROCURONIUM BROMIDE 10 MG/ML (PF) SYRINGE
PREFILLED_SYRINGE | INTRAVENOUS | Status: DC | PRN
  Administered 2020-07-14: 90 mg via INTRAVENOUS

## 2020-07-14 SURGICAL SUPPLY — 59 items
BENZOIN TINCTURE PRP APPL 2/3 (GAUZE/BANDAGES/DRESSINGS) IMPLANT
BLADE CLIPPER SURG (BLADE) IMPLANT
BLADE SURG 11 STRL SS (BLADE) ×3 IMPLANT
CANISTER SUCT 3000ML PPV (MISCELLANEOUS) ×3 IMPLANT
CARTRIDGE OIL MAESTRO DRILL (MISCELLANEOUS) IMPLANT
COVER WAND RF STERILE (DRAPES) IMPLANT
DECANTER SPIKE VIAL GLASS SM (MISCELLANEOUS) ×3 IMPLANT
DERMABOND ADVANCED (GAUZE/BANDAGES/DRESSINGS) ×4
DERMABOND ADVANCED .7 DNX12 (GAUZE/BANDAGES/DRESSINGS) ×2 IMPLANT
DIFFUSER DRILL AIR PNEUMATIC (MISCELLANEOUS) IMPLANT
DRAPE CAMERA VIDEO/LASER (DRAPES) ×3 IMPLANT
DRAPE HALF SHEET 40X57 (DRAPES) ×3 IMPLANT
DRAPE LAPAROTOMY 100X72 PEDS (DRAPES) ×3 IMPLANT
DRSG OPSITE POSTOP 3X4 (GAUZE/BANDAGES/DRESSINGS) IMPLANT
DRSG OPSITE POSTOP 4X6 (GAUZE/BANDAGES/DRESSINGS) ×6 IMPLANT
DRSG TEGADERM 4X4.75 (GAUZE/BANDAGES/DRESSINGS) IMPLANT
DURAPREP 6ML APPLICATOR 50/CS (WOUND CARE) ×3 IMPLANT
ELECT COATED BLADE 2.86 ST (ELECTRODE) ×3 IMPLANT
ELECT REM PT RETURN 9FT ADLT (ELECTROSURGICAL) ×3
ELECTRODE REM PT RTRN 9FT ADLT (ELECTROSURGICAL) ×1 IMPLANT
GAUZE 4X4 16PLY RFD (DISPOSABLE) IMPLANT
GENERATOR MODEL 106 ASPIRE (Neuro Prosthesis/Implant) ×3 IMPLANT
GLOVE BIO SURGEON STRL SZ 6.5 (GLOVE) ×8 IMPLANT
GLOVE BIO SURGEON STRL SZ7.5 (GLOVE) ×3 IMPLANT
GLOVE BIO SURGEONS STRL SZ 6.5 (GLOVE) ×4
GLOVE BIOGEL PI IND STRL 6.5 (GLOVE) ×2 IMPLANT
GLOVE BIOGEL PI IND STRL 7.5 (GLOVE) ×2 IMPLANT
GLOVE BIOGEL PI INDICATOR 6.5 (GLOVE) ×4
GLOVE BIOGEL PI INDICATOR 7.5 (GLOVE) ×4
GLOVE ECLIPSE 7.0 STRL STRAW (GLOVE) ×3 IMPLANT
GOWN STRL REUS W/ TWL LRG LVL3 (GOWN DISPOSABLE) ×4 IMPLANT
GOWN STRL REUS W/ TWL XL LVL3 (GOWN DISPOSABLE) IMPLANT
GOWN STRL REUS W/TWL 2XL LVL3 (GOWN DISPOSABLE) IMPLANT
GOWN STRL REUS W/TWL LRG LVL3 (GOWN DISPOSABLE) ×8
GOWN STRL REUS W/TWL XL LVL3 (GOWN DISPOSABLE)
HEMOSTAT POWDER KIT SURGIFOAM (HEMOSTASIS) ×3 IMPLANT
KIT BASIN OR (CUSTOM PROCEDURE TRAY) ×3 IMPLANT
KIT TURNOVER KIT B (KITS) ×3 IMPLANT
LEAD PERENNIAFLEX 2-3 304 (Neuro Prosthesis/Implant) ×3 IMPLANT
LOOP VESSEL MAXI BLUE (MISCELLANEOUS) ×3 IMPLANT
LOOP VESSEL MINI RED (MISCELLANEOUS) IMPLANT
MARKER SKIN DUAL TIP RULER LAB (MISCELLANEOUS) ×3 IMPLANT
NEEDLE HYPO 22GX1.5 SAFETY (NEEDLE) ×3 IMPLANT
NS IRRIG 1000ML POUR BTL (IV SOLUTION) ×3 IMPLANT
OIL CARTRIDGE MAESTRO DRILL (MISCELLANEOUS)
PACK LAMINECTOMY NEURO (CUSTOM PROCEDURE TRAY) ×3 IMPLANT
PAD ARMBOARD 7.5X6 YLW CONV (MISCELLANEOUS) ×9 IMPLANT
SPONGE INTESTINAL PEANUT (DISPOSABLE) IMPLANT
SPONGE SURGIFOAM ABS GEL SZ50 (HEMOSTASIS) IMPLANT
SUT ETHILON 3 0 FSL (SUTURE) IMPLANT
SUT NURALON 4 0 TR CR/8 (SUTURE) ×3 IMPLANT
SUT VIC AB 0 CT1 27 (SUTURE) ×2
SUT VIC AB 0 CT1 27XBRD ANBCTR (SUTURE) ×1 IMPLANT
SUT VIC AB 3-0 SH 8-18 (SUTURE) ×6 IMPLANT
SUT VICRYL 3-0 RB1 18 ABS (SUTURE) ×9 IMPLANT
TOWEL GREEN STERILE (TOWEL DISPOSABLE) ×3 IMPLANT
TOWEL GREEN STERILE FF (TOWEL DISPOSABLE) ×3 IMPLANT
TUNNELING TOOL (MISCELLANEOUS) ×3 IMPLANT
WATER STERILE IRR 1000ML POUR (IV SOLUTION) ×3 IMPLANT

## 2020-07-14 NOTE — Transfer of Care (Signed)
Immediate Anesthesia Transfer of Care Note  Patient: Harry Carr  Procedure(s) Performed: VAGAL NERVE STIMULATOR IMPLANT (Left Chest)  Patient Location: PACU  Anesthesia Type:General  Level of Consciousness: awake, alert  and oriented  Airway & Oxygen Therapy: Patient Spontanous Breathing  Post-op Assessment: Report given to RN and Post -op Vital signs reviewed and stable  Post vital signs: Reviewed and stable  Last Vitals:  Vitals Value Taken Time  BP 117/68 07/14/20 1121  Temp    Pulse 99 07/14/20 1124  Resp 19 07/14/20 1124  SpO2 99 % 07/14/20 1124  Vitals shown include unvalidated device data.  Last Pain:  Vitals:   07/14/20 0729  TempSrc:   PainSc: 0-No pain         Complications: No complications documented.

## 2020-07-14 NOTE — Op Note (Signed)
  NEUROSURGERY OPERATIVE NOTE   PREOP DIAGNOSIS:  1. Medically intractable Epilepsy   POSTOP DIAGNOSIS: Same  PROCEDURE: 1. Left vagal nerve stimulator placement  SURGEON: Dr. Consuella Lose, MD  ASSISTANT: None  ANESTHESIA: General Endotracheal  EBL: Minimal  SPECIMENS: None  DRAINS: None  COMPLICATIONS: None immediate  CONDITION: Hemodynamically stable to PACU  HISTORY: Harry Carr is a 39 y.o. male with a history of cerebral palsy and medically intractable epilepsy.  His seizure work-up included epilepsy monitoring unit stay which demonstrated likelihood of bilateral seizure foci.  He continues to have complex partial seizures with occasional generalized seizures despite treatment with antiepileptic medications.  He was therefore referred for placement of a left vagal nerve stimulator.  The risks, benefits, and alternatives to the surgery as well as the expected postoperative course and recovery were all reviewed in detail with the patient's mother.  After all their questions were answered, she provided consent on his behalf to proceed.  PROCEDURE IN DETAIL: The patient was brought to the operating room. After induction of general anesthesia, the patient was positioned on the operative table in the supine position. All pressure points were meticulously padded.  Left-sided transverse neck incision as well as infraclavicular incision were then marked out and prepped and draped in the usual sterile fashion.  After timeout was conducted, the left-sided skin incision was infiltrated with local anesthetic with epinephrine.  The incision was then opened sharply and carried down through the subcutaneous tissue.  The subcutaneous layer was then undermined on top of the platysma.  Platysma was then divided in a longitudinal fashion.  Natural fascial planes in the neck were used to dissect until the medial border of the sternocleidomastoid muscle was identified.  The jugular vein was  initially identified and dissection was carried medially to identify the carotid artery.  The carotid sheath was then opened, and the vagus nerve was identified between the 2 vessels.  The vagus nerve was then dissected circumferentially for a length of approximately 2.5 cm.  The infraclavicular incision was then infiltrated with local anesthetic with epinephrine.  Incision was made sharply and Bovie was used to dissect through subcutaneous tissue until the surface of the pectoralis major muscle was identified.  Subcutaneous pocket was then created.  Tunneler was then used to pass the vagal nerve stimulator leads subcutaneously.  The generator was then connected and placed in the subcutaneous chest pocket.  The leads were then encircled around the vagus nerve.  The system was then interrogated and found to have good impedance.  Heart rate monitor was also calibrated.  At this point, the wounds were irrigated with normal saline irrigation.  Hemostasis was secured using a combination of bipolar electrocautery and morselized Gelfoam and thrombin.  Relaxing curl was then placed on the leads in the neck and secured.  The neck incision was then closed in multiple layers in standard fashion using a combination of interrupted 3-0 Vicryl stitches.  Similarly, the chest incision was also closed in layers with 3-0 Vicryl.  Dermabond was then applied.  Once the Dermabond dried, sterile dressings were placed.  At the end of the case all sponge, needle, instrument, and cottonoid counts were correct.  Patient was then extubated and taken to the postanesthesia care unit in stable hemodynamic condition.  Consuella Lose, MD [facility]

## 2020-07-14 NOTE — Anesthesia Postprocedure Evaluation (Signed)
Anesthesia Post Note  Patient: Harry Carr  Procedure(s) Performed: VAGAL NERVE STIMULATOR IMPLANT (Left Chest)     Patient location during evaluation: PACU Anesthesia Type: General Level of consciousness: awake Pain management: pain level controlled Vital Signs Assessment: post-procedure vital signs reviewed and stable Respiratory status: spontaneous breathing Cardiovascular status: stable Postop Assessment: no apparent nausea or vomiting Anesthetic complications: no   No complications documented.  Last Vitals:  Vitals:   07/14/20 1136 07/14/20 1145  BP: 122/77   Pulse: 99 93  Resp: 19 18  Temp:    SpO2: 100% 100%    Last Pain:  Vitals:   07/14/20 1121  TempSrc:   PainSc: 0-No pain                 Chancy Claros

## 2020-07-14 NOTE — H&P (Signed)
  Chief Complaint   Intractable SZ  History of Present Illness  Harry Carr is a 39 y.o. male with a history of cerebral palsy and medically intractable epilepsy.  He is currently maintained on multiple antiepileptic medications with continued complex partial seizures and occasional generalized seizures.  He has been worked up by neurology including inpatient EMU stay demonstrating the likelihood of bilateral seizure foci.  He therefore presents today for vagal nerve stimulator placement.  Past Medical History   Past Medical History:  Diagnosis Date  . Headache   . Hypertension    mild  . Mild mental retardation   . Seizures ([place])   . 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 dorsal rhizotomy  age 67 yrs  . WISDOM TOOTH EXTRACTION  03/2010    Social History   Social History   Tobacco Use  . Smoking status: Never Smoker  . Smokeless tobacco: Never Used  Substance Use Topics  . Alcohol use: Never  . Drug use: No    Medications   Prior to Admission medications   Medication Sig Start Date End Date Taking? Authorizing Provider  aspirin 81 MG tablet Take 81 mg by mouth daily.   Yes [provider]  diazePAM, 20 MG Dose, (VALTOCO 20 MG DOSE) 2 x 10 MG/0.1ML LQPK Place 1 spray into the nose as needed (each nostril 1 spray in status epilepticus). Patient taking differently: Place 1 spray into the nose daily as needed (each nostril 1 spray in status epilepticus).  04/21/20  Yes Dohmeier, Asencion Partridge, MD  lacosamide (VIMPAT) 200 MG TABS tablet Take 1 tablet (200 mg total) by mouth 2 (two) times daily. 04/21/20  Yes Dohmeier, Asencion Partridge, MD  lamoTRIgine (LAMICTAL) 100 MG tablet BRAND NAME ONLY, 3 tabs 2 times a day Patient taking differently: Take 300 mg by mouth in the morning and at bedtime. BRAND NAME ONLY, 05/05/20  Yes Dohmeier, Asencion Partridge, MD  levETIRAcetam (KEPPRA) 500 MG tablet Take 3  tablets (1,500 mg total) by mouth 2 (two) times daily. BRAND NAME ONLY 05/18/20  Yes Dohmeier, Asencion Partridge, MD  losartan (COZAAR) 100 MG tablet Take 1 tablet (100 mg total) by mouth daily. 07/06/20  Yes Lillard Anes, MD  Multiple Vitamins-Minerals (MULTIVITAMIN WITH MINERALS) tablet Take 1 tablet by mouth daily.   Yes [provider]  vitamin E 45 MG (100 UNITS) capsule Take 500 Units by mouth daily.   Yes [provider]  diazepam (DIASAT) 20 MG GEL Place 20 mg rectally once as needed for up to 1 dose. Patient taking differently: Place 20 mg rectally as needed (Seizure).  04/02/20   Dohmeier, Asencion Partridge, MD    Allergies  No Known Allergies  Review of Systems  ROS  Neurologic Exam  Awake, alert Responds appropriately CN grossly intact Spastic left hemiparesis Moves RUE/RLE well  Impression  - 39 y.o. male with medically intractable epilepsy, good candidate for VNS placement  Plan  - Will proceed with Left VNS placement  I have reviewed the details of surgery, associated risks, benefits, and alternatives at length with the patient's mother (HCPOA) in the office. All her questions today were answered and she provided consent to proceed.  Consuella Lose, MD [facility]

## 2020-07-14 NOTE — Anesthesia Procedure Notes (Signed)
Procedure Name: Intubation Date/Time: 07/14/2020 8:54 AM Performed by: Clearnce Sorrel, CRNA Pre-anesthesia Checklist: Patient identified, Emergency Drugs available, Suction available, Patient being monitored and Timeout performed Patient Re-evaluated:Patient Re-evaluated prior to induction Oxygen Delivery Method: Circle system utilized Preoxygenation: Pre-oxygenation with 100% oxygen Induction Type: IV induction Ventilation: Mask ventilation without difficulty Laryngoscope Size: 4 and Glidescope Grade View: Grade I Tube size: 7.5 mm Number of attempts: 1 Airway Equipment and Method: Stylet Placement Confirmation: ETT inserted through vocal cords under direct vision,  positive ETCO2 and breath sounds checked- equal and bilateral Secured at: 23 cm Tube secured with: Tape Dental Injury: Teeth and Oropharynx as per pre-operative assessment

## 2020-07-14 NOTE — Discharge Summary (Addendum)
Physician Discharge Summary  Patient ID: Harry Carr MRN: [id number] DOB/AGE: 38-Nov-1982 38 y.o.  Admit date: 07/14/2020 Discharge date: 07/14/2020  Admission Diagnoses:  Medically intractable epilepsy  Discharge Diagnoses:  Same Active Problems:   * No active hospital problems. *   Discharged Condition: Stable  Hospital Course:  Harry Carr is a 39 y.o. male who underwent uncomplicated placement of a left VNS. He was at baseline postop and discharged home from PACU in stable condition.  Treatments: Surgery - Placement of left VNS  Discharge Exam: Blood pressure 117/68, pulse 95, temperature (!) 97.4 F (36.3 C), resp. rate 19, height 5\' 10"  (1.778 m), weight 74.8 kg, SpO2 100 %. Awake, alert Answers appropriately CN grossly intact Spastic left hemiparesis Wound c/d/i  Disposition: Discharge disposition: 01-Home or Self Care       Discharge Instructions    Call MD for:  redness, tenderness, or signs of infection (pain, swelling, redness, odor or green/yellow discharge around incision site)   Complete by: As directed    Call MD for:  temperature >100.4   Complete by: As directed    Diet - low sodium heart healthy   Complete by: As directed    Discharge instructions   Complete by: As directed    Increase activity slowly   Complete by: As directed    Lifting restrictions   Complete by: As directed    No lifting > 10 lbs   May shower / Bathe   Complete by: As directed    48 hours after surgery   Other Restrictions   Complete by: As directed    No bending/twisting at waist   Remove dressing in 48 hours   Complete by: As directed      Allergies as of 07/14/2020   No Known Allergies     Medication List    TAKE these medications   aspirin 81 MG tablet Take 81 mg by mouth daily.   diazepam 20 MG Gel Commonly known as: DIASAT Place 20 mg rectally once as needed for up to 1 dose. What changed:   when to take this  reasons to take this   Valtoco  20 MG Dose 2 x 10 MG/0.1ML Lqpk Generic drug: diazePAM (20 MG Dose) Place 1 spray into the nose as needed (each nostril 1 spray in status epilepticus). What changed: when to take this   HYDROcodone-acetaminophen 5-325 MG tablet Commonly known as: Norco Take 1 tablet by mouth every 8 (eight) hours as needed for moderate pain.   lacosamide 200 MG Tabs tablet Commonly known as: VIMPAT Take 1 tablet (200 mg total) by mouth 2 (two) times daily.   lamoTRIgine 100 MG tablet Commonly known as: LAMICTAL BRAND NAME ONLY, 3 tabs 2 times a day What changed:   how much to take  how to take this  when to take this  additional instructions   levETIRAcetam 500 MG tablet Commonly known as: KEPPRA Take 3 tablets (1,500 mg total) by mouth 2 (two) times daily. BRAND NAME ONLY   losartan 100 MG tablet Commonly known as: COZAAR Take 1 tablet (100 mg total) by mouth daily.   multivitamin with minerals tablet Take 1 tablet by mouth daily.   vitamin E 45 MG (100 UNITS) capsule Take 500 Units by mouth daily.       Follow-up Information    Consuella Lose, MD In 2 weeks.   Specialty: Neurosurgery Why: For wound re-check Contact information: [address] [place] [address]  [phone]               Signed: Jairo Ben 07/14/2020, 11:35 AM

## 2020-07-15 ENCOUNTER — Telehealth: Payer: Self-pay | Admitting: Neurology

## 2020-07-15 ENCOUNTER — Encounter (HOSPITAL_COMMUNITY): Payer: Self-pay | Admitting: Neurosurgery

## 2020-07-15 NOTE — Telephone Encounter (Signed)
Pt's mother is asking for a call from [facility] re: when pt's VNS can be turned on.  Please call

## 2020-07-15 NOTE — Telephone Encounter (Signed)
Called the patient's mom back and after speaking with the VNS  Rep Meghan she has asked that he come in 2 weeks post op. Apt made 07/28/2020 at 11:30 am

## 2020-07-28 ENCOUNTER — Telehealth: Payer: Self-pay | Admitting: *Deleted

## 2020-07-28 ENCOUNTER — Encounter: Payer: Self-pay | Admitting: Neurology

## 2020-07-28 ENCOUNTER — Ambulatory Visit (INDEPENDENT_AMBULATORY_CARE_PROVIDER_SITE_OTHER): Payer: Medicare Other | Admitting: Neurology

## 2020-07-28 ENCOUNTER — Ambulatory Visit: Payer: Self-pay | Admitting: Neurology

## 2020-07-28 VITALS — BP 127/74 | HR 70

## 2020-07-28 DIAGNOSIS — G808 Other cerebral palsy: Secondary | ICD-10-CM

## 2020-07-28 DIAGNOSIS — G40011 Localization-related (focal) (partial) idiopathic epilepsy and epileptic syndromes with seizures of localized onset, intractable, with status epilepticus: Secondary | ICD-10-CM | POA: Diagnosis not present

## 2020-07-28 DIAGNOSIS — G249 Dystonia, unspecified: Secondary | ICD-10-CM

## 2020-07-28 NOTE — Telephone Encounter (Signed)
I have attempted to reach the patient's family multiple times. Unable to leave a message on the listed home number. Left two message on the mobile number.

## 2020-07-28 NOTE — Telephone Encounter (Signed)
He has been added to Dr. Rhea Belton schedule for VNS activation today.

## 2020-07-28 NOTE — Progress Notes (Signed)
Chief Complaint  Patient presents with  . Seizures    Dr. Edwena Felty patient. He is here with his mother, [name], to have his VNS activated. It was placed on 07/14/20.     HISTORICAL  Harry Carr is a 39 year male, accompanied by his mother, seen in clinic following VNS placement by Dr. Kathyrn Sheriff on Jul 14 2020,   he came in today to have VNS turned on.  He had spastic quadriplegia, left worse than right, complex partial symptomatic epilepsy, He is also a current patient of Dr. Doy Mince at [facility] where he receives Botox injections to overcome the hemispasticity.  I reviewed seizure documentation from his mother, he is having seizure about every other days, mostly around 7-9pm, despite polypharmacy, taking vimpat 220m bid, Lamictal (brand) 1061m3 tabs bid, keppra 50042m tabs bid, mother describes his seizures as staring spells, looking towards the left side, pausing for few minutes, no generalized clonic seizure recently  He had a history of status epilepticus in August 2000, requiring h hospitalization  He is wheelchair-bound, nonambulatory, enjoys watching ball games,  He received a left vagal nerve stimulator by neurosurgeon Dr. NunKathyrn Sheriff July 14, 2020, incision healing well,  REVIEW OF SYSTEMS: Full 14 system review of systems performed and notable only for as above All other review of systems were negative.  ALLERGIES: No Known Allergies  HOME MEDICATIONS: Current Outpatient Medications  Medication Sig Dispense Refill  . aspirin 81 MG tablet Take 81 mg by mouth daily.    . diazepam (DIASAT) 20 MG GEL Place 20 mg rectally once as needed for up to 1 dose. (Patient taking differently: Place 20 mg rectally as needed (Seizure). ) 1 each 0  . diazePAM, 20 MG Dose, (VALTOCO 20 MG DOSE) 2 x 10 MG/0.1ML LQPK Place 1 spray into the nose as needed (each nostril 1 spray in status epilepticus). (Patient taking differently: Place 1 spray into the nose daily as needed (each nostril 1  spray in status epilepticus). ) 3 each 3  . HYDROcodone-acetaminophen (NORCO) 5-325 MG tablet Take 1 tablet by mouth every 8 (eight) hours as needed for moderate pain. 20 tablet 0  . lacosamide (VIMPAT) 200 MG TABS tablet Take 1 tablet (200 mg total) by mouth 2 (two) times daily. 180 tablet 3  . lamoTRIgine (LAMICTAL) 100 MG tablet BRAND NAME ONLY, 3 tabs 2 times a day (Patient taking differently: Take 300 mg by mouth in the morning and at bedtime. BRAND NAME ONLY,) 180 tablet 11  . levETIRAcetam (KEPPRA) 500 MG tablet Take 3 tablets (1,500 mg total) by mouth 2 (two) times daily. BRAND NAME ONLY 180 tablet 11  . losartan (COZAAR) 100 MG tablet Take 1 tablet (100 mg total) by mouth daily. 30 tablet 0  . Multiple Vitamins-Minerals (MULTIVITAMIN WITH MINERALS) tablet Take 1 tablet by mouth daily.    . vitamin E 45 MG (100 UNITS) capsule Take 500 Units by mouth daily.     Current Facility-Administered Medications  Medication Dose Route Frequency Provider Last Rate Last Admin  . diazepam (DIASTAT) rectal kit 2.5 mg  2.5 mg Rectal Once Dohmeier, CarAsencion PartridgeD        PAST MEDICAL HISTORY: Past Medical History:  Diagnosis Date  . Headache   . Hypertension    mild  . Mild mental retardation   . S/P placement of VNS (vagus nerve stimulation) device    placed on 07/14/20  . Seizures ([place] . 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 dorsal rhizotomy  age 50 yrs  . VAGUS NERVE STIMULATOR INSERTION Left 07/14/2020   Procedure: VAGAL NERVE STIMULATOR IMPLANT;  Surgeon: Consuella Lose, MD;  Location: [facility];  Service: Neurosurgery;  Laterality: Left;  anterior  . WISDOM TOOTH EXTRACTION  03/2010    FAMILY HISTORY: Family History  Problem Relation Age of Onset  . Heart attack Father   . Heart disease Maternal Grandmother   . Cancer - Other Maternal Grandfather        lukemia    SOCIAL  HISTORY: Social History   Socioeconomic History  . Marital status: Single    Spouse name: Not on file  . Number of children: Not on file  . Years of education: Not on file  . Highest education level: Not on file  Occupational History  . Not on file  Tobacco Use  . Smoking status: Never Smoker  . Smokeless tobacco: Never Used  Substance and Sexual Activity  . Alcohol use: Never  . Drug use: No  . Sexual activity: Not on file  Other Topics Concern  . Not on file  Social History Narrative   Right handed. Caffeine none.  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   Social Determinants of Health   Financial Resource Strain:   . Difficulty of Paying Living Expenses: Not on file  Food Insecurity:   . Worried About Charity fundraiser in the Last Year: Not on file  . Ran Out of Food in the Last Year: Not on file  Transportation Needs:   . Lack of Transportation (Medical): Not on file  . Lack of Transportation (Non-Medical): Not on file  Physical Activity:   . Days of Exercise per Week: Not on file  . Minutes of Exercise per Session: Not on file  Stress:   . Feeling of Stress : Not on file  Social Connections:   . Frequency of Communication with Friends and Family: Not on file  . Frequency of Social Gatherings with Friends and Family: Not on file  . Attends Religious Services: Not on file  . Active Member of Clubs or Organizations: Not on file  . Attends Archivist Meetings: Not on file  . Marital Status: Not on file  Intimate Partner Violence:   . Fear of Current or Ex-Partner: Not on file  . Emotionally Abused: Not on file  . Physically Abused: Not on file  . Sexually Abused: Not on file     PHYSICAL EXAM   Vitals:   07/28/20 1045  BP: 127/74  Pulse: 70   Not recorded     There is no height or weight on file to calculate BMI.  PHYSICAL EXAMNIATION:  Gen: NAD, conversant, well nourised, well groomed           NEUROLOGICAL  EXAM:  MENTAL STATUS: Speech/Cognition: Awake, alert, follow simple command, slurred, spastic voice.  Rely on his mother to provide history  CRANIAL NERVES: CN II: Visual fields are full to confrontation. Pupils are round equal and briskly reactive to light. CN III, IV, VI: disconjugate eye movement. CN V: Facial sensation is intact to light touch CN VII: Face is symmetric with normal eye closure  CN VIII: Hearing is normal to causal conversation. CN IX, X: Phonation is normal. CN XI: Head turning and shoulder shrug are intact  MOTOR: Spastic quadriplegia, left worse than right, left hand  dystonic posturing, cupping of left hand, hyperextension of left fingers, difficulty to fully extend left elbow  REFLEXES: Hypoactive and symmetric  SENSORY: With draw to pain  COORDINATION: Proportional to spasticity and weakness  GAIT/STANCE: Deferred.  DIAGNOSTIC DATA (LABS, IMAGING, TESTING) - I reviewed patient records, labs, notes, testing and imaging myself where available.   ASSESSMENT AND PLAN  Donnald Tabar is a 39 y.o. male  Cerebral palsy, spastic quadriplegia, left worse than right Intractable complex partial seizure, on polypharmacy treatment,  Vimpat 200 mg twice a day, Lamictal brand name 100 mg 3 tablets twice a day, Keppra 500 mg 3 tablets twice a day  Continue have recurrent seizure multiple times each week described as staring spells, pause  Left vagal nerve stimulator placement on July 14, 2020 (turned on July 28, 2020)   VNS: AspireSR M106  Generator Serial No. [id number]  Normal OutPut Current: 0.125 mA Signal Frequency: 20 Hz  Pulse Width:  250  sec Signal on time: 30 seconds Signal off time:  5 minute   AutoStimulation: Output: 0.25 mA Pulse width: 250 sec On time: 30 second  Magnetic Output Current: 0.375 mA Pulse Width: 250 sec Signal On Time:  60 Sec Diagnostic Impedance:  4333 OHMS  l confirmed the VNS settings. The VNS stimulator was  interrogated and reprogrammed as above setting   Total time spent reviewing the chart, obtaining history, examined patient, ordering tests, documentation, consultations and family, care coordination was [place]       Marcial Pacas, M.D. Ph.D.  [facility] [address] [place], [place] [address] Ph: [phone] Fax: [phone]  CC:  Lillard Anes, MD [address] [place],  [place] [address]  Lillard Anes, MD

## 2020-07-28 NOTE — Telephone Encounter (Signed)
I was able to get in touch with the mother at 9:20 am. She stated she had called and spoke with someone and was told we had her down. Advised I appreciated her calling back and sorry I didn't get the message. She did indicate she is leaving her house now heading in.

## 2020-07-28 NOTE — Telephone Encounter (Signed)
Harry Carr has an appt today with Dr. Brett Fairy. Unfortunately, she has to be out of the office.  He was scheduled to have his VNS activated. Dr. Krista Blue is happy to see this patient.  His original time was set for 11:30am. Dr. Krista Blue will need to see him at 10:30am. The appt has been blocked and I have requested a call back to see if they would like to accept it. If not, then please offer him another time with Dr. Brett Fairy.

## 2020-08-13 ENCOUNTER — Telehealth: Payer: Self-pay | Admitting: Neurology

## 2020-08-13 NOTE — Telephone Encounter (Signed)
Pt's mother [name] called wanting to discuss the pts appts to increase his VNS, Please advise.

## 2020-08-13 NOTE — Telephone Encounter (Signed)
Called the mom back and advised that we needed to have a 1 mth follow up scheduled for the VNS. I have scheduled them with Jan 4,2022 at 11:30 am. Mom would really like the patient to be seen sooner. Advised if anything opens up the VNS rep can do 12/20 at 11/11:30 am. I advised I would look to see if I could move a patient to get him seen sooner. Pt's mom verbalized understanding.

## 2020-08-17 ENCOUNTER — Telehealth: Payer: Self-pay | Admitting: Neurology

## 2020-08-17 NOTE — Telephone Encounter (Signed)
PA completed through cover my meds/ [employer] medicare. TGA:[id number] Pt has tried and failed the meds in question and is stable on Keppra. Approved immediately. 05/19/2020 - 08/17/2021

## 2020-08-17 NOTE — Telephone Encounter (Signed)
PA approved for the pt through [employer] This approval authorizes your coverage from 05/19/2020 - 08/17/2021,

## 2020-08-17 NOTE — Telephone Encounter (Signed)
PA submitted through cover my meds/ [employer]. KEY: [id number] Will await response.

## 2020-08-17 NOTE — Telephone Encounter (Signed)
Called and offered the Thursday at 10:30 am with our NP Jinny Blossom, NP. Our VNS rep is also aware and will be present for the pt.

## 2020-08-19 ENCOUNTER — Other Ambulatory Visit: Payer: Self-pay

## 2020-08-19 ENCOUNTER — Encounter: Payer: Self-pay | Admitting: Legal Medicine

## 2020-08-19 ENCOUNTER — Ambulatory Visit (INDEPENDENT_AMBULATORY_CARE_PROVIDER_SITE_OTHER): Payer: Medicare Other | Admitting: Legal Medicine

## 2020-08-19 VITALS — BP 104/80 | HR 70 | Temp 97.5°F | Resp 17 | Ht 70.0 in | Wt 164.9 lb

## 2020-08-19 DIAGNOSIS — G808 Other cerebral palsy: Secondary | ICD-10-CM | POA: Diagnosis not present

## 2020-08-19 DIAGNOSIS — G40309 Generalized idiopathic epilepsy and epileptic syndromes, not intractable, without status epilepticus: Secondary | ICD-10-CM | POA: Diagnosis not present

## 2020-08-19 DIAGNOSIS — I10 Essential (primary) hypertension: Secondary | ICD-10-CM

## 2020-08-19 LAB — CBC WITH DIFFERENTIAL/PLATELET
Basophils Absolute: 0 10*3/uL (ref 0.0–0.2)
Basos: 1 %
EOS (ABSOLUTE): 0.1 10*3/uL (ref 0.0–0.4)
Eos: 2 %
Hematocrit: 42.5 % (ref 37.5–51.0)
Hemoglobin: 15.3 g/dL (ref 13.0–17.7)
Immature Grans (Abs): 0 10*3/uL (ref 0.0–0.1)
Immature Granulocytes: 0 %
Lymphocytes Absolute: 1.7 10*3/uL (ref 0.7–3.1)
Lymphs: 42 %
MCH: 31.9 pg (ref 26.6–33.0)
MCHC: 36 g/dL — ABNORMAL HIGH (ref 31.5–35.7)
MCV: 89 fL (ref 79–97)
Monocytes Absolute: 0.4 10*3/uL (ref 0.1–0.9)
Monocytes: 10 %
Neutrophils Absolute: 1.9 10*3/uL (ref 1.4–7.0)
Neutrophils: 45 %
Platelets: 276 10*3/uL (ref 150–450)
RBC: 4.79 x10E6/uL (ref 4.14–5.80)
RDW: 11.2 % — ABNORMAL LOW (ref 11.6–15.4)
WBC: 4.1 10*3/uL (ref 3.4–10.8)

## 2020-08-19 LAB — COMPREHENSIVE METABOLIC PANEL
ALT: 14 IU/L (ref 0–44)
AST: 21 IU/L (ref 0–40)
Albumin/Globulin Ratio: 2.1 (ref 1.2–2.2)
Albumin: 4.4 g/dL (ref 4.0–5.0)
Alkaline Phosphatase: 139 IU/L — ABNORMAL HIGH (ref 44–121)
BUN/Creatinine Ratio: 8 — ABNORMAL LOW (ref 9–20)
BUN: 7 mg/dL (ref 6–20)
Bilirubin Total: 1 mg/dL (ref 0.0–1.2)
CO2: 23 mmol/L (ref 20–29)
Calcium: 9.7 mg/dL (ref 8.7–10.2)
Chloride: 101 mmol/L (ref 96–106)
Creatinine, Ser: 0.88 mg/dL (ref 0.76–1.27)
GFR calc Af Amer: 126 mL/min/{1.73_m2} (ref >59)
GFR calc non Af Amer: 109 mL/min/{1.73_m2} (ref >59)
Globulin, Total: 2.1 g/dL (ref 1.5–4.5)
Glucose: 77 mg/dL (ref 65–99)
Potassium: 4.3 mmol/L (ref 3.5–5.2)
Sodium: 140 mmol/L (ref 134–144)
Total Protein: 6.5 g/dL (ref 6.0–8.5)

## 2020-08-19 MED ORDER — LOSARTAN POTASSIUM 100 MG PO TABS: 100.0000 mg | ORAL_TABLET | Freq: Every day | ORAL | 6 refills | Status: DC

## 2020-08-19 NOTE — Progress Notes (Signed)
Subjective:  Patient ID: Harry Carr, male    DOB: 09/04/1981  Age: 39 y.o. MRN: [id number]  Chief Complaint  Patient presents with  . Seizures    HPI: patient has chronic seizures and had placement of VNS which has helped seizures by Dr. Kathyrn Sheriff. He is having less seizures an it was just turned on at low setting   .Patient presents for follow up of hypertension.  Patient tolerating losartan well with side effects.  Patient was diagnosed with hypertension 2010 so has been treated for hypertension for 10 years.Patient is working on maintaining diet and exercise regimen and follows up as directed. Complication include none. Current Outpatient Medications on File Prior to Visit  Medication Sig Dispense Refill  . aspirin 81 MG tablet Take 81 mg by mouth daily.    . diazepam (DIASAT) 20 MG GEL Place 20 mg rectally once as needed for up to 1 dose. (Patient taking differently: Place 20 mg rectally as needed (Seizure).) 1 each 0  . diazePAM, 20 MG Dose, (VALTOCO 20 MG DOSE) 2 x 10 MG/0.1ML LQPK Place 1 spray into the nose as needed (each nostril 1 spray in status epilepticus). (Patient taking differently: Place 1 spray into the nose daily as needed (each nostril 1 spray in status epilepticus).) 3 each 3  . lacosamide (VIMPAT) 200 MG TABS tablet Take 1 tablet (200 mg total) by mouth 2 (two) times daily. 180 tablet 3  . lamoTRIgine (LAMICTAL) 100 MG tablet BRAND NAME ONLY, 3 tabs 2 times a day (Patient taking differently: Take 300 mg by mouth in the morning and at bedtime. BRAND NAME ONLY,) 180 tablet 11  . levETIRAcetam (KEPPRA) 500 MG tablet Take 3 tablets (1,500 mg total) by mouth 2 (two) times daily. BRAND NAME ONLY 180 tablet 11  . Multiple Vitamins-Minerals (MULTIVITAMIN WITH MINERALS) tablet Take 1 tablet by mouth daily.    . vitamin E 45 MG (100 UNITS) capsule Take 500 Units by mouth daily.     Current Facility-Administered Medications on File Prior to Visit  Medication Dose Route  Frequency Provider Last Rate Last Admin  . diazepam (DIASTAT) rectal kit 2.5 mg  2.5 mg Rectal Once Dohmeier, Asencion Partridge, MD       Past Medical History:  Diagnosis Date  . Headache   . Hypertension    mild  . Mild mental retardation   . S/P placement of VNS (vagus nerve stimulation) device    placed on 07/14/20  . Seizures ([place])   . 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 dorsal rhizotomy  age 82 yrs  . VAGUS NERVE STIMULATOR INSERTION Left 07/14/2020   Procedure: VAGAL NERVE STIMULATOR IMPLANT;  Surgeon: Consuella Lose, MD;  Location: [facility];  Service: Neurosurgery;  Laterality: Left;  anterior  . WISDOM TOOTH EXTRACTION  03/2010    Family History  Problem Relation Age of Onset  . Heart attack Father   . Heart disease Maternal Grandmother   . Cancer - Other Maternal Grandfather        lukemia   Social History   Socioeconomic History  . Marital status: Single    Spouse name: Not on file  . Number of children: 0  . Years of education: Not on file  . Highest education level: Not on file  Occupational History  . Not on file  Tobacco Use  . Smoking status: Never Smoker  . Smokeless tobacco: Never Used  Substance  and Sexual Activity  . Alcohol use: Never  . Drug use: No  . Sexual activity: Not Currently  Other Topics Concern  . Not on file  Social History Narrative   Right handed. Caffeine none.  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   Social Determinants of Health   Financial Resource Strain: Not on file  Food Insecurity: Not on file  Transportation Needs: Not on file  Physical Activity: Not on file  Stress: Not on file  Social Connections: Not on file    Review of Systems  Constitutional: Negative for activity change, fatigue and fever.  HENT: Positive for congestion. Negative for sinus pain.   Eyes: Negative for visual disturbance.  Respiratory: Negative for apnea, cough,  choking and wheezing.   Cardiovascular: Positive for chest pain. Negative for leg swelling.  Gastrointestinal: Negative for abdominal distention and abdominal pain.  Genitourinary: Negative for difficulty urinating and dysuria.  Musculoskeletal: Negative for arthralgias and back pain.  Skin: Negative for color change.  Neurological: Positive for seizures.       In wheel chair  Psychiatric/Behavioral: Negative for agitation and behavioral problems.     Objective:  BP 104/80   Pulse 70   Temp (!) 97.5 F (36.4 C)   Resp 17   Ht 5' 10"  (1.778 m)   Wt 164 lb 14.4 oz (74.8 kg)   BMI 23.66 kg/m   BP/Weight 08/19/2020 07/28/2020 11/10/5434  Systolic BP 067 703 403  Diastolic BP 80 74 73  Wt. (Lbs) 164.9 - 165  BMI 23.66 - 23.68    Physical Exam Vitals reviewed.  Constitutional:      Appearance: Normal appearance.  HENT:     Right Ear: Tympanic membrane normal.     Left Ear: Tympanic membrane normal.     Mouth/Throat:     Mouth: Mucous membranes are moist.  Eyes:     Extraocular Movements: Extraocular movements intact.     Conjunctiva/sclera: Conjunctivae normal.     Pupils: Pupils are equal, round, and reactive to light.  Cardiovascular:     Rate and Rhythm: Normal rate and regular rhythm.     Pulses: Normal pulses.     Heart sounds: Normal heart sounds.  Pulmonary:     Effort: Pulmonary effort is normal.     Breath sounds: Normal breath sounds.  Abdominal:     General: There is no distension.  Musculoskeletal:     Cervical back: Normal range of motion.  Skin:    General: Skin is warm and dry.     Capillary Refill: Capillary refill takes less than 2 seconds.  Neurological:     Mental Status: He is alert. Mental status is at baseline.     Motor: Weakness present.     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  The patient's status was assessed using  clinical findings on exam, labs, and other diagnostic testing. Patient's success at meeting treatment goals based on disease specific evidence-bassed guidelines and found to be in fair control. With new implanted device. Less seizures since turned on. RECOMMENDATIONS include maintaiing present medicines and treatment.  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  It is on low and seizures cut in 1/2  3. Essential hypertension, benign An individual hypertension care plan was established and reinforced today.  The patient's status was assessed using clinical findings on exam and labs or diagnostic tests. The patient's success at meeting treatment goals on disease specific evidence-based guidelines and found to be fair controlled. SELF MANAGEMENT: The patient and I together assessed ways to personally work towards obtaining the recommended goals. RECOMMENDATIONS: avoid decongestants found in common cold remedies, decrease consumption of alcohol, perform routine monitoring of BP with home BP cuff, exercise, reduction of dietary salt, take medicines as prescribed, try not to miss doses and quit smoking.  Regular exercise and maintaining a healthy weight is needed.  Stress reduction may help. A CLINICAL SUMMARY including written plan identify barriers to care unique to individual due to social or financial issues.  We attempt to mutually creat solutions for individual and family understanding.          I spent 20  minutes dedicated to the care of this patient on  the date of this encounter to include face-to-face time with the patient, as well as: discussed his seizures with patient and mother.  Follow-up: Return in about 6 months (around 02/17/2021).  An After Visit Summary was printed and given to the patient.  Reinaldo Meeker, MD [facility] [phone]

## 2020-08-20 ENCOUNTER — Ambulatory Visit (INDEPENDENT_AMBULATORY_CARE_PROVIDER_SITE_OTHER): Payer: Medicare Other | Admitting: Adult Health

## 2020-08-20 VITALS — BP 128/70 | HR 74

## 2020-08-20 DIAGNOSIS — G40011 Localization-related (focal) (partial) idiopathic epilepsy and epileptic syndromes with seizures of localized onset, intractable, with status epilepticus: Secondary | ICD-10-CM | POA: Diagnosis not present

## 2020-08-20 DIAGNOSIS — Z9689 Presence of other specified functional implants: Secondary | ICD-10-CM

## 2020-08-20 DIAGNOSIS — C751 Malignant neoplasm of pituitary gland: Secondary | ICD-10-CM | POA: Diagnosis not present

## 2020-08-20 DIAGNOSIS — G808 Other cerebral palsy: Secondary | ICD-10-CM | POA: Diagnosis not present

## 2020-08-20 DIAGNOSIS — G40209 Localization-related (focal) (partial) symptomatic epilepsy and epileptic syndromes with complex partial seizures, not intractable, without status epilepticus: Secondary | ICD-10-CM

## 2020-08-20 NOTE — Progress Notes (Signed)
PATIENT: Harry Carr DOB: [birth year]  REASON FOR VISIT: follow up HISTORY FROM: patient  HISTORY OF PRESENT ILLNESS: Today 08/20/20:  [name] is a 39 year old male with a history of intractable seizures.  He returns today for VNS adjustment.  The patient states that since the VNS his episodes have decreased to 2-3 episodes a week.  They continue to occur between 630 and 9:30 PM.  Tolerates the current settings well.  He also notes that he has not had a severe headache after every event.  He remains on Vimpat, Keppra and Lamictal.  Returns today for an evaluation.  HISTORY Harry Carr is a 39 year male, accompanied by his mother, seen in clinic following VNS placement by Dr. Kathyrn Carr on Jul 14 2020,   he came in today to have VNS turned on.  He had spastic quadriplegia, left worse than right, complex partial symptomatic epilepsy, He is also a current patient of Dr. Doy Carr at [facility] where he receives Botox injections to overcome the hemispasticity.  I reviewed seizure documentation from his mother, he is having seizure about every other days, mostly around 7-9pm, despite polypharmacy, taking vimpat 229m bid, Lamictal (brand) 107m3 tabs bid, keppra 50082m tabs bid, mother describes his seizures as staring spells, looking towards the left side, pausing for few minutes, no generalized clonic seizure recently  He had a history of status epilepticus in August 2000, requiring h hospitalization  He is wheelchair-bound, nonambulatory, enjoys watching ball games,  He received a left vagal nerve stimulator by neurosurgeon Dr. NunKathyrn Carr July 14, 2020, incision healing well,  REVIEW OF SYSTEMS: Out of a complete 14 system review of symptoms, the patient complains only of the following symptoms, and all other reviewed systems are negative.  ALLERGIES: No Known Allergies  HOME MEDICATIONS: Outpatient Medications Prior to Visit  Medication Sig Dispense Refill  . aspirin 81 MG  tablet Take 81 mg by mouth daily.    . diazepam (DIASAT) 20 MG GEL Place 20 mg rectally once as needed for up to 1 dose. (Patient taking differently: Place 20 mg rectally as needed (Seizure).) 1 each 0  . diazePAM, 20 MG Dose, (VALTOCO 20 MG DOSE) 2 x 10 MG/0.1ML LQPK Place 1 spray into the nose as needed (each nostril 1 spray in status epilepticus). (Patient taking differently: Place 1 spray into the nose daily as needed (each nostril 1 spray in status epilepticus).) 3 each 3  . lacosamide (VIMPAT) 200 MG TABS tablet Take 1 tablet (200 mg total) by mouth 2 (two) times daily. 180 tablet 3  . lamoTRIgine (LAMICTAL) 100 MG tablet BRAND NAME ONLY, 3 tabs 2 times a day (Patient taking differently: Take 300 mg by mouth in the morning and at bedtime. BRAND NAME ONLY,) 180 tablet 11  . levETIRAcetam (KEPPRA) 500 MG tablet Take 3 tablets (1,500 mg total) by mouth 2 (two) times daily. BRAND NAME ONLY 180 tablet 11  . losartan (COZAAR) 100 MG tablet Take 1 tablet (100 mg total) by mouth daily. 30 tablet 6  . Multiple Vitamins-Minerals (MULTIVITAMIN WITH MINERALS) tablet Take 1 tablet by mouth daily.    . vitamin E 45 MG (100 UNITS) capsule Take 500 Units by mouth daily.     Facility-Administered Medications Prior to Visit  Medication Dose Route Frequency Provider Last Rate Last Admin  . diazepam (DIASTAT) rectal kit 2.5 mg  2.5 mg Rectal Once Harry Carr, Harry Carr        PAST MEDICAL HISTORY: Past Medical  History:  Diagnosis Date  . Headache   . Hypertension    mild  . Mild mental retardation   . S/P placement of VNS (vagus nerve stimulation) device    placed on 07/14/20  . Seizures ([place])   . 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 dorsal rhizotomy  age 55 yrs  . VAGUS NERVE STIMULATOR INSERTION Left 07/14/2020   Procedure: VAGAL NERVE STIMULATOR IMPLANT;  Surgeon: Consuella Lose, MD;  Location: [facility];  Service: Neurosurgery;  Laterality: Left;  anterior  . WISDOM TOOTH EXTRACTION  03/2010    FAMILY HISTORY: Family History  Problem Relation Age of Onset  . Heart attack Father   . Heart disease Maternal Grandmother   . Cancer - Other Maternal Grandfather        lukemia    SOCIAL HISTORY: Social History   Socioeconomic History  . Marital status: Single    Spouse name: Not on file  . Number of children: 0  . Years of education: Not on file  . Highest education level: Not on file  Occupational History  . Not on file  Tobacco Use  . Smoking status: Never Smoker  . Smokeless tobacco: Never Used  Substance and Sexual Activity  . Alcohol use: Never  . Drug use: No  . Sexual activity: Not Currently  Other Topics Concern  . Not on file  Social History Narrative   Right handed. Caffeine none.  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   Social Determinants of Health   Financial Resource Strain: Not on file  Food Insecurity: Not on file  Transportation Needs: Not on file  Physical Activity: Not on file  Stress: Not on file  Social Connections: Not on file  Intimate Partner Violence: Not on file      PHYSICAL EXAM  Vitals:   08/20/20 1026  BP: 128/70  Pulse: 74   There is no height or weight on file to calculate BMI.  Generalized: Well developed, in no acute distress   Neurological examination  Mentation: Alert oriented to time, place, history taking. Follows all commands  Cranial nerve II-XII: Pupils were equal round reactive to light. Extraocular movements were full, visual field were full on confrontational test. Facial sensation and strength were normal. Uvula tongue midline. Head turning and shoulder shrug  were normal and symmetric. Motor: Spastic quadriplegia-with left hand dystonic posturing.   Sensory: Sensory testing is intact to soft touch on all 4 extremities. No evidence of extinction is noted.  Coordination: Unable to complete due to  spasticity and weakness Gait and station: Patient is in a wheelchair   DIAGNOSTIC DATA (LABS, IMAGING, TESTING) - I reviewed patient records, labs, notes, testing and imaging myself where available.  Lab Results  Component Value Date   WBC 4.1 08/19/2020   HGB 15.3 08/19/2020   HCT 42.5 08/19/2020   MCV 89 08/19/2020   PLT 276 08/19/2020      Component Value Date/Time   NA 140 08/19/2020 1019   K 4.3 08/19/2020 1019   CL 101 08/19/2020 1019   CO2 23 08/19/2020 1019   GLUCOSE 77 08/19/2020 1019   GLUCOSE 99 07/14/2020 0648   BUN 7 08/19/2020 1019   CREATININE 0.88 08/19/2020 1019   CALCIUM 9.7 08/19/2020 1019   PROT 6.5 08/19/2020 1019   ALBUMIN 4.4 08/19/2020 1019   AST 21 08/19/2020 1019  ALT 14 08/19/2020 1019   ALKPHOS 139 (H) 08/19/2020 1019   BILITOT 1.0 08/19/2020 1019   GFRNONAA 109 08/19/2020 1019   GFRNONAA >60 07/14/2020 0648   GFRAA 126 08/19/2020 1019      ASSESSMENT AND PLAN 39 y.o. 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 here with:  1.  Intractable seizures  The patient's VNS was interrogated current settings are listed below.  3 parameter changes was made also listed below.  Patient was also advised that he can swipe his magnet 2-3 times an hour from 6-9 PM which is when he typically has his events.  The patient will continue on current medication regimen which includes Vimpat 200 mg twice a day, Lamictal 300 mg twice a day and Keppra 1500 mg twice a day.  He will follow-up in 1 month for further VNS adjustments  VNS: AspireSR M106  Generator Serial No. [id number]  Normal OutPut Current: 0.125 mA --> 0.25 mA Signal Frequency: 20 Hz  Pulse Width:  250  sec Signal on time: 30 seconds Signal off time:  5 minute   AutoStimulation: Output: 0.25 mA -->0.375 mA Pulse  width: 250 sec On time: 30 second  Magnetic Output Current: 0.375 mA --> 0.5 mA Pulse Width: 250 sec Signal On Time:  60 Sec Diagnostic Impedance:  4333 OHMS  I spent 30 minutes of face-to-face and non-face-to-face time with patient.  This included previsit chart review, lab review, study review, order entry, electronic health record documentation, patient education.  Ward Givens, MSN, NP-C 08/20/2020, 10:39 AM [facility] [address], [place], [place] [address] [phone]

## 2020-08-20 NOTE — Progress Notes (Signed)
CBC normal, kidney and liver tests normal lp

## 2020-09-08 ENCOUNTER — Encounter: Payer: Self-pay | Admitting: Neurology

## 2020-09-08 ENCOUNTER — Other Ambulatory Visit: Payer: Self-pay

## 2020-09-08 ENCOUNTER — Ambulatory Visit (INDEPENDENT_AMBULATORY_CARE_PROVIDER_SITE_OTHER): Payer: Medicare Other | Admitting: Neurology

## 2020-09-08 ENCOUNTER — Ambulatory Visit: Payer: Self-pay | Admitting: Neurology

## 2020-09-08 VITALS — BP 109/71 | HR 69 | Ht 70.0 in | Wt 164.5 lb

## 2020-09-08 DIAGNOSIS — G40011 Localization-related (focal) (partial) idiopathic epilepsy and epileptic syndromes with seizures of localized onset, intractable, with status epilepticus: Secondary | ICD-10-CM

## 2020-09-08 DIAGNOSIS — G40209 Localization-related (focal) (partial) symptomatic epilepsy and epileptic syndromes with complex partial seizures, not intractable, without status epilepticus: Secondary | ICD-10-CM | POA: Diagnosis not present

## 2020-09-08 DIAGNOSIS — Z9689 Presence of other specified functional implants: Secondary | ICD-10-CM

## 2020-09-08 DIAGNOSIS — C751 Malignant neoplasm of pituitary gland: Secondary | ICD-10-CM | POA: Diagnosis not present

## 2020-09-08 DIAGNOSIS — G808 Other cerebral palsy: Secondary | ICD-10-CM | POA: Diagnosis not present

## 2020-09-08 NOTE — Progress Notes (Signed)
PATIENT: Harry Carr DOB: Jun 15, 1981  REASON FOR VISIT: follow up HISTORY FROM: patient and his mother.   HISTORY OF PRESENT ILLNESS:  09/08/20:09-08-2020' INTERVAL  HISTORY CD: Harry Carr is a 40 year male, accompanied by his mother, seen in clinic following VNS placement by Dr. Kathyrn Sheriff on Jul 14 2020,  and then activated by Dr. Krista Blue at [facility]. .   [name] is a 40 year old male with a history of intractable seizures and wheelchair bound- .  He returns today for VNS adjustment.  The patient states that since the VNS his episodes have decreased to 2-3 episodes a week. The triggers for this patient are flickering lights, sometimes whole driving and when the son is low, sometimes on TV screen.   They continued to occur between 630 and 9:30 PM but less frequent-  Tolerates the current VNS settings well.   He encountered occassional hoarseness and swallowing difficulties, his mother swipes the magnet   Several times a day. She also noted that he has not had a severe headache after every event.   He remains on Vimpat, Keppra and Lamictal.   Current settings -his VNS is in as tired SR and 106 with the serial #[id number]. The device was activated by Dr Krista Blue on 11.23.2021 in my absence. The output setting is 0.25 mA frequency 20 Hz pulse width 250 s on time 30 seconds off time 5 minutes duty cycle 10%. Auto stimulation setting for an output of 0.375 mA pulse was 250 s on time 30 seconds magnet is set to an output of 0.5 mA same pulse width and on time here 60 seconds.  Generator battery is close to 100%.  We adjusted the outputs setting today to 0.375 mA this will also change the outer stimulation setting to 0.5 mA and the magnet 206.25 mA. Impedance and on is 4346 ohms, the generator battery close to 100%, the  output current: 0.375 mA  Also set are detection features of 40 tachycardia detection is enabled the threshold for the detection to a response is at 40% increase of heart rate.  Magnet  swipe was performed in office.  The patient has a slight cough, a clearing of the throat in response to activation.  Voice was hoarse.      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  He is also a current patient of Dr. Doy Mince at [facility] where he receives Botox injections to overcome the hemispasticity.  Seen here today 3 weeks after hospitalization at [facility] for status epilepticus, medications were increased in response to that activity 550 mg of Vimpat twice a day to 200 mg of Vimpat twice a day.  He could not be transferred to [facility] as there were no beds, but and observation.  In the emergency room took place.  Keppra was given 2 g by intravenous infusion and his dose of Lamictal was just continued but not altered.  He received at home nasal spray diazepam which did not shorten the seizure activity in the ED and the IV was established and 1 mg of Ativan was administered a second dose of 1 mg was given with 5 L oxygen by nasal cannula.  The patient began vomiting about 90 minutes after these 2 doses were given complained of a headache and appeared very drowsy he continued to get fluids is also  given Toradol and Zofran.  Harry Carr is now 40 years old he has a history of cerebral palsy and seizure disorder and presented to the ED for an evaluation of a new breakthrough seizure 20 minutes prior to arrival by private car the patient had began seizing at home and this exceeded in duration his usual brief seizures of 10 to 15 seconds duration which she has at least weekly.  At [facility] a CBC with differential was done and the white blood cell count was 13.7 elevated with an absolute lymphocyte count of 6.9K per liter and an absolute monocyte count of 1.2 mL this could have been a viral infection.  However the patient tested  negative for Covid and a urine sample which was delivered the next day did not give any evidence of a urinary tract infection.  Liver function and kidney function were normal a protocol of the medications given in the emergency room was printed and reviewed.  However he has since been taking the higher dose of Vimpat without being sedated or in any way obviously changed.  His primary care physician is Dr. Lillard Anes, MD.  neither the patient nor his mother have been covid vaccinated, claiming their 1 neurologist was hesitant to recommned vaccine in light of Corey's tendency to have  break through seizures. I explained,that a vaccine without a living attenuated virus, such as an mRNA c vaccine , would be unlikely to cause interference.   My goal would be to use a nasal spray with midazolam, rather than Diazepam.   I reviewed seizure documentation from his mother, he is having seizure about every other days, mostly around 7-9pm, despite polypharmacy, taking vimpat 268m bid, Lamictal (brand) 1061m3 tabs bid, keppra 50031m tabs bid, mother describes his seizures as staring spells, looking towards the left side, pausing for few minutes, no generalized clonic seizure recently  He had a history of status epilepticus in August 2000, requiring h hospitalization  He is wheelchair-bound, nonambulatory, enjoys watching ball games,  He received a left vagal nerve stimulator by neurosurgeon Dr. NunKathyrn Sheriff July 14, 2020, incision healing well,  REVIEW OF SYSTEMS: Out of a complete 14 system review of symptoms, the patient complains only of the following symptoms, and all other reviewed systems are negative.  Better seizure control.  Has dysphagia and dysphonia.     ALLERGIES: No Known Allergies  HOME MEDICATIONS: Outpatient Medications Prior to Visit  Medication Sig Dispense Refill  . aspirin 81 MG tablet Take 81 mg by mouth daily.    . diazepam (DIASAT) 20 MG GEL Place 20 mg  rectally once as needed for up to 1 dose. (Patient taking differently: Place 20 mg rectally as needed (Seizure).) 1 each 0  . diazePAM, 20 MG Dose, (VALTOCO 20 MG DOSE) 2 x 10 MG/0.1ML LQPK Place 1 spray into the nose as needed (each nostril 1 spray in status epilepticus). (Patient taking differently: Place 1 spray into the nose daily as needed (each nostril 1 spray in status epilepticus).) 3 each 3  . lacosamide (VIMPAT) 200 MG TABS tablet Take 1 tablet (200 mg total) by mouth 2 (two) times daily. 180 tablet 3  . lamoTRIgine (LAMICTAL) 100 MG tablet BRAND NAME ONLY, 3 tabs 2 times a day (Patient taking differently: Take 300 mg by mouth in the morning and at bedtime. BRAND NAME ONLY,) 180 tablet 11  . levETIRAcetam (KEPPRA) 500 MG tablet Take 3 tablets (1,500 mg total) by mouth 2 (two) times daily.  BRAND NAME ONLY 180 tablet 11  . losartan (COZAAR) 100 MG tablet Take 1 tablet (100 mg total) by mouth daily. 30 tablet 6  . Multiple Vitamins-Minerals (MULTIVITAMIN WITH MINERALS) tablet Take 1 tablet by mouth daily.    . vitamin E 45 MG (100 UNITS) capsule Take 500 Units by mouth daily.     Facility-Administered Medications Prior to Visit  Medication Dose Route Frequency Provider Last Rate Last Admin  . diazepam (DIASTAT) rectal kit 2.5 mg  2.5 mg Rectal Once Fayetta Sorenson, Asencion Partridge, MD        PAST MEDICAL HISTORY: Past Medical History:  Diagnosis Date  . Headache   . Hypertension    mild  . Mild mental retardation   . S/P placement of VNS (vagus nerve stimulation) device    placed on 07/14/20  . Seizures ([place])   . 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 dorsal rhizotomy  age 72 yrs  . VAGUS NERVE STIMULATOR INSERTION Left 07/14/2020   Procedure: VAGAL NERVE STIMULATOR IMPLANT;  Surgeon: Consuella Lose, MD;  Location: [facility];  Service: Neurosurgery;  Laterality: Left;  anterior  . WISDOM TOOTH  EXTRACTION  03/2010    FAMILY HISTORY: Family History  Problem Relation Age of Onset  . Heart attack Father   . Heart disease Maternal Grandmother   . Cancer - Other Maternal Grandfather        lukemia    SOCIAL HISTORY: Social History   Socioeconomic History  . Marital status: Single    Spouse name: Not on file  . Number of children: 0  . Years of education: Not on file  . Highest education level: Not on file  Occupational History  . Not on file  Tobacco Use  . Smoking status: Never Smoker  . Smokeless tobacco: Never Used  Substance and Sexual Activity  . Alcohol use: Never  . Drug use: No  . Sexual activity: Not Currently  Other Topics Concern  . Not on file  Social History Narrative   Right handed. Caffeine none.  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   Social Determinants of Health   Financial Resource Strain: Not on file  Food Insecurity: Not on file  Transportation Needs: Not on file  Physical Activity: Not on file  Stress: Not on file  Social Connections: Not on file  Intimate Partner Violence: Not on file      PHYSICAL EXAM  Vitals:   09/08/20 1125  BP: 109/71  Pulse: 69  Weight: 164 lb 8 oz (74.6 kg)  Height: _0  (1.778 m)   Body mass index is 23.6 kg/m.  Generalized: Well developed, in no acute distress     DIAGNOSTIC DATA (LABS, IMAGING, TESTING) - I reviewed patient records, labs, notes, testing and imaging myself where available.  Lab Results  Component Value Date   WBC 4.1 08/19/2020   HGB 15.3 08/19/2020   HCT 42.5 08/19/2020   MCV 89 08/19/2020   PLT 276 08/19/2020      Component Value Date/Time   NA 140 08/19/2020 1019   K 4.3 08/19/2020 1019   CL 101 08/19/2020 1019   CO2 23 08/19/2020 1019   GLUCOSE 77 08/19/2020 1019   GLUCOSE 99 07/14/2020 0648   BUN 7 08/19/2020 1019   CREATININE 0.88 08/19/2020 1019   CALCIUM 9.7 08/19/2020 1019   PROT 6.5 08/19/2020 1019   ALBUMIN  4.4 08/19/2020 1019    AST 21 08/19/2020 1019   ALT 14 08/19/2020 1019   ALKPHOS 139 (H) 08/19/2020 1019   BILITOT 1.0 08/19/2020 1019   GFRNONAA 109 08/19/2020 1019   GFRNONAA >60 07/14/2020 0648   GFRAA 126 08/19/2020 1019      ASSESSMENT AND PLAN 40 y.o. 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 here with:  1.  Intractable seizures requiring VNS therapy in addition to medication.   The patient will continue on current medication regimen which includes Vimpat 200 mg twice a day, Lamictal 300 mg twice a day and Keppra 1500 mg twice a day.  Mom did not bring his seizure journal, he had no seizure in 8 days!    He will follow-up in 2 months for further VNS adjustments wit dr Brett Fairy, MD.  VNS: AspireSR [id number]  Generator Serial No. [id number]  Normal OutPut Current: 0.375 now  Signal Frequency: 20 Hz  Pulse Width:  250  sec Signal on time: 30 seconds Signal off time:  5 minute   AutoStimulation: Output: 0.5 mA now  Pulse width: 250 sec On time: 30 second  Magnetic Output Current: 0.625 mA.   Pulse Width: 250 sec Signal On Time:  60 Sec Diagnostic Impedance:  increased. OHMS  I spent 30 minutes of face-to-face and non-face-to-face time with patient.  This included previsit chart review, lab review, study review, order entry, electronic health record documentation, patient education.  Larey Seat, MD  09/08/2020, 11:53 AM [facility] [address], [place], [place] [address] [phone]

## 2020-10-09 DIAGNOSIS — G808 Other cerebral palsy: Secondary | ICD-10-CM | POA: Diagnosis not present

## 2020-10-14 ENCOUNTER — Encounter: Payer: Self-pay | Admitting: Neurology

## 2020-10-14 ENCOUNTER — Ambulatory Visit (INDEPENDENT_AMBULATORY_CARE_PROVIDER_SITE_OTHER): Payer: Medicare Other | Admitting: Neurology

## 2020-10-14 VITALS — BP 108/64 | HR 63

## 2020-10-14 DIAGNOSIS — Z4542 Encounter for adjustment and management of neuropacemaker (brain) (peripheral nerve) (spinal cord): Secondary | ICD-10-CM | POA: Diagnosis not present

## 2020-10-14 DIAGNOSIS — G40209 Localization-related (focal) (partial) symptomatic epilepsy and epileptic syndromes with complex partial seizures, not intractable, without status epilepticus: Secondary | ICD-10-CM

## 2020-10-14 DIAGNOSIS — G40309 Generalized idiopathic epilepsy and epileptic syndromes, not intractable, without status epilepticus: Secondary | ICD-10-CM | POA: Diagnosis not present

## 2020-10-14 DIAGNOSIS — G808 Other cerebral palsy: Secondary | ICD-10-CM | POA: Diagnosis not present

## 2020-10-14 DIAGNOSIS — R569 Unspecified convulsions: Secondary | ICD-10-CM | POA: Diagnosis not present

## 2020-10-14 DIAGNOSIS — G40011 Localization-related (focal) (partial) idiopathic epilepsy and epileptic syndromes with seizures of localized onset, intractable, with status epilepticus: Secondary | ICD-10-CM | POA: Diagnosis not present

## 2020-10-14 MED ORDER — VALTOCO 20 MG DOSE 10 MG/0.1ML NA LQPK: 1.0000 | NASAL | 3 refills | Status: DC | PRN

## 2020-10-14 NOTE — Patient Instructions (Signed)
He stays on oral anticonvulsants as current.  CD

## 2020-10-14 NOTE — Progress Notes (Signed)
   PATIENT: Harry Carr DOB: 11/26/1980  REASON FOR VISIT: follow up HISTORY FROM: patient and his mother.   HISTORY OF PRESENT ILLNESS:  10/14/20:  10-14-2020: Harry Bruno, RN, is  assisting. Harry Carr on the phone.  Interval History - still having 8-9 seizures a month, mostly at 6-8 Pm, subtherapeutic on current settings. Not lasting as long and no longer having headaches. Not coughing .    Menu: output current up from 0.37 mA - increased new 0.5 mA- final 0.625 mA Magnet 0.635 - increased to 0.75 mA. And increased finally to 0.875 mA AUTO stimulation 0.375- to 0.5 today.- now 0.75  Pulse width 250 micro sec.  On time 30 sec.   Signal on 30 sec. Signal off 5 min.  Swipe magnet-  Step 1 was tolerated , step 2 further up, doing well .magnet swiped- patient reported no discomfort.   We rest 3 or more function in 2 steps each. He stays on oral anticonvulsants as current.  CD       09-08-2020' INTERVAL  HISTORY CD: Vern Kracke is a 39 year male, accompanied by his mother, seen in clinic following VNS placement by Dr. Nundkumar on Jul 14 2020,  and then activated by Dr. Yan at [facility]. .   Harry Carr is a 39-year-Carr male with a history of intractable seizures and wheelchair bound- .  He returns today for VNS adjustment.  The patient states that since the VNS his episodes have decreased to 2-3 episodes a week. The triggers for this patient are flickering lights, sometimes whole driving and when the son is low, sometimes on TV screen.   They continued to occur between 630 and 9:30 PM but less frequent-  Tolerates the current VNS settings well.   He encountered occassional hoarseness and swallowing difficulties, his mother swipes the magnet   Several times a day. She also noted that he has not had a severe headache after every event.   He remains on Vimpat, Keppra and Lamictal.   Current settings -his VNS is in as tired SR and 106 with the serial #[id number]. The device was activated by  Dr Yan on 11.23.2021 in my absence. The output setting is 0.25 mA frequency 20 Hz pulse width 250 s on time 30 seconds off time 5 minutes duty cycle 10%. Auto stimulation setting for an output of 0.375 mA pulse was 250 s on time 30 seconds magnet is set to an output of 0.5 mA same pulse width and on time here 60 seconds.  Generator battery is close to 100%.  We adjusted the outputs setting today to 0.375 mA this will also change the outer stimulation setting to 0.5 mA and the magnet 206.25 mA. Impedance and on is 4346 ohms, the generator battery close to 100%, the  output current: 0.375 mA  Also set are detection features of 40 tachycardia detection is enabled the threshold for the detection to a response is at 40% increase of heart rate.  Magnet swipe was performed in office.  The patient has a slight cough, a clearing of the throat in response to activation.  Voice was hoarse.      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  He is also a current patient of Dr. Reynolds at [facility] where he receives Botox injections to overcome the hemispasticity.  Seen here   today 3 weeks after hospitalization at [facility] for status epilepticus, medications were increased in response to that activity 550 mg of Vimpat twice a day to 200 mg of Vimpat twice a day.  He could not be transferred to [place] as there were no beds, but and observation.  In the emergency room took place.  Keppra was given 2 g by intravenous infusion and his dose of Lamictal was just continued but not altered.  He received at home nasal spray diazepam which did not shorten the seizure activity in the ED and the IV was established and 1 mg of Ativan was administered a second dose of 1 mg was given with 5 L oxygen by nasal cannula.  The patient began vomiting about 90 minutes  after these 2 doses were given complained of a headache and appeared very drowsy he continued to get fluids is also given Toradol and Zofran.  Harry Carr he has a history of cerebral palsy and seizure disorder and presented to the ED for an evaluation of a new breakthrough seizure 20 minutes prior to arrival by private car the patient had began seizing at home and this exceeded in duration his usual brief seizures of 10 to 15 seconds duration which she has at least weekly.  At [facility] a CBC with differential was done and the white blood cell count was 13.7 elevated with an absolute lymphocyte count of 6.9K per liter and an absolute monocyte count of 1.2 mL this could have been a viral infection.  However the patient tested negative for Covid and a urine sample which was delivered the next day did not give any evidence of a urinary tract infection.  Liver function and kidney function were normal a protocol of the medications given in the emergency room was printed and reviewed.  However he has since been taking the higher dose of Vimpat without being sedated or in any way obviously changed.  His primary care physician is Dr. Lawrence Edward Perry, MD.  neither the patient nor his mother have been covid vaccinated, claiming their [facility] neurologist was hesitant to recommned vaccine in light of Harry's tendency to have  break through seizures. I explained,that a vaccine without a living attenuated virus, such as an mRNA c vaccine , would be unlikely to cause interference.   My goal would be to use a nasal spray with midazolam, rather than Diazepam.   I reviewed seizure documentation from his mother, he is having seizure about every other days, mostly around 7-9pm, despite polypharmacy, taking vimpat 200mg bid, Lamictal (brand) 100mg 3 tabs bid, keppra 500mg 3 tabs bid, mother describes his seizures as staring spells, looking towards the left side, pausing for few minutes, no generalized  clonic seizure recently  He had a history of status epilepticus in August 2000, requiring h hospitalization  He is wheelchair-bound, nonambulatory, enjoys watching ball games,  He received a left vagal nerve stimulator by neurosurgeon Dr. Nundkumar on July 14, 2020, incision healing well,  REVIEW OF SYSTEMS: Out of a complete 14 system review of symptoms, the patient complains only of the following symptoms, and all other reviewed systems are negative.  Better seizure control.  Has dysphagia and dysphonia.     ALLERGIES: No Known Allergies  HOME MEDICATIONS: Outpatient Medications Prior to Visit  Medication Sig Dispense Refill  . aspirin 81 MG tablet Take 81 mg by mouth daily.    . diazepam (DIASAT) 20 MG GEL Place 20 mg   rectally once as needed for up to 1 dose. (Patient taking differently: Place 20 mg rectally as needed (Seizure).) 1 each 0  . diazePAM, 20 MG Dose, (VALTOCO 20 MG DOSE) 2 x 10 MG/0.1ML LQPK Place 1 spray into the nose as needed (each nostril 1 spray in status epilepticus). (Patient taking differently: Place 1 spray into the nose daily as needed (each nostril 1 spray in status epilepticus).) 3 each 3  . lacosamide (VIMPAT) 200 MG TABS tablet Take 1 tablet (200 mg total) by mouth 2 (two) times daily. 180 tablet 3  . lamoTRIgine (LAMICTAL) 100 MG tablet BRAND NAME ONLY, 3 tabs 2 times a day (Patient taking differently: Take 300 mg by mouth in the morning and at bedtime. BRAND NAME ONLY,) 180 tablet 11  . levETIRAcetam (KEPPRA) 500 MG tablet Take 3 tablets (1,500 mg total) by mouth 2 (two) times daily. BRAND NAME ONLY 180 tablet 11  . losartan (COZAAR) 100 MG tablet Take 1 tablet (100 mg total) by mouth daily. 30 tablet 6  . Multiple Vitamins-Minerals (MULTIVITAMIN WITH MINERALS) tablet Take 1 tablet by mouth daily.    . vitamin E 45 MG (100 UNITS) capsule Take 500 Units by mouth daily.    . diazepam (DIASTAT) rectal kit 2.5 mg      No facility-administered  medications prior to visit.    PAST MEDICAL HISTORY: Past Medical History:  Diagnosis Date  . Headache   . Hypertension    mild  . Mild mental retardation   . S/P placement of VNS (vagus nerve stimulation) device    placed on 07/14/20  . Seizures ([place])   . 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)     PAST SURGICAL HISTORY: Past Surgical History:  Procedure Laterality Date  . dorsal rhizotomy  age 44 yrs  . VAGUS NERVE STIMULATOR INSERTION Left 07/14/2020   Procedure: VAGAL NERVE STIMULATOR IMPLANT;  Surgeon: Consuella Lose, MD;  Location: [place];  Service: Neurosurgery;  Laterality: Left;  anterior  . WISDOM TOOTH EXTRACTION  03/2010    FAMILY HISTORY: Family History  Problem Relation Age of Onset  . Heart attack Father   . Heart disease Maternal Grandmother   . Cancer - Other Maternal Grandfather        lukemia    SOCIAL HISTORY: Social History   Socioeconomic History  . Marital status: Single    Spouse name: Not on file  . Number of children: 0  . Years of education: Not on file  . Highest education level: Not on file  Occupational History  . Not on file  Tobacco Use  . Smoking status: Never Smoker  . Smokeless tobacco: Never Used  Substance and Sexual Activity  . Alcohol use: Never  . Drug use: No  . Sexual activity: Not Currently  Other Topics Concern  . Not on file  Social History Narrative   Right handed. Caffeine none.  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   Social Determinants of Health   Financial Resource Strain: Not on file  Food Insecurity: Not on file  Transportation Needs: Not on file  Physical Activity: Not on file  Stress: Not on file  Social Connections: Not on file  Intimate Partner Violence: Not on file      PHYSICAL EXAM  Vitals:   10/14/20 1423  BP: 108/64  Pulse: 63   There is no height or weight on file to calculate BMI.  Generalized: Well developed, in no acute distress  DIAGNOSTIC DATA (LABS, IMAGING, TESTING) - I reviewed patient records, labs, notes, testing and imaging myself where available.  Lab Results  Component Value Date   WBC 4.1 08/19/2020   HGB 15.3 08/19/2020   HCT 42.5 08/19/2020   MCV 89 08/19/2020   PLT 276 08/19/2020      Component Value Date/Time   NA 140 08/19/2020 1019   K 4.3 08/19/2020 1019   CL 101 08/19/2020 1019   CO2 23 08/19/2020 1019   GLUCOSE 77 08/19/2020 1019   GLUCOSE 99 07/14/2020 0648   BUN 7 08/19/2020 1019   CREATININE 0.88 08/19/2020 1019   CALCIUM 9.7 08/19/2020 1019   PROT 6.5 08/19/2020 1019   ALBUMIN 4.4 08/19/2020 1019   AST 21 08/19/2020 1019   ALT 14 08/19/2020 1019   ALKPHOS 139 (H) 08/19/2020 1019   BILITOT 1.0 08/19/2020 1019   GFRNONAA 109 08/19/2020 1019   GFRNONAA >60 07/14/2020 0648   GFRAA 126 08/19/2020 1019      ASSESSMENT AND PLAN 39 y.o. 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 here with:  1.  Intractable seizures requiring VNS therapy in addition to medication.   The patient will continue on current medication regimen which includes Vimpat 200 mg twice a day, Lamictal 300 mg twice a day and Keppra 1500 mg twice a day. He stays on oral anticonvulsants as current.  CD   Mom did not bring his seizure journal, he had no seizure in 8 days!   VNS : We reset 3 or more function in 2 steps each.  He will follow-up in 2 months for further VNS adjustments wit Dr Dohmeier, MD.  VNS: AspireSR M106  Generator Serial No. [id number]   I spent 20 minutes of face-to-face and non-face-to-face time with patient.  This included previsit chart review, lab review, study review, order entry, electronic health record documentation, patient education.  RV in 2 month  Carmen Dohmeier, MD  10/14/2020, 2:35  PM [facility] [address], [address] [place], [place] [address] [phone]   

## 2020-10-22 ENCOUNTER — Ambulatory Visit: Payer: Medicare Other | Admitting: Family Medicine

## 2020-11-02 ENCOUNTER — Other Ambulatory Visit: Payer: Self-pay | Admitting: Neurology

## 2020-11-02 MED ORDER — LACOSAMIDE 200 MG PO TABS: 200.0000 mg | ORAL_TABLET | Freq: Two times a day (BID) | ORAL | 1 refills | Status: DC

## 2020-11-05 ENCOUNTER — Ambulatory Visit: Payer: Medicare Other | Admitting: Family Medicine

## 2020-11-16 IMAGING — DX DG CHEST 1V
1 series · 1 of 1 positions shown · non-contrast
Comparison: Portable exam 8081 hours without priors for comparison

CLINICAL DATA: Aspiration into airway

EXAM:
CHEST  1 VIEW

[chest]
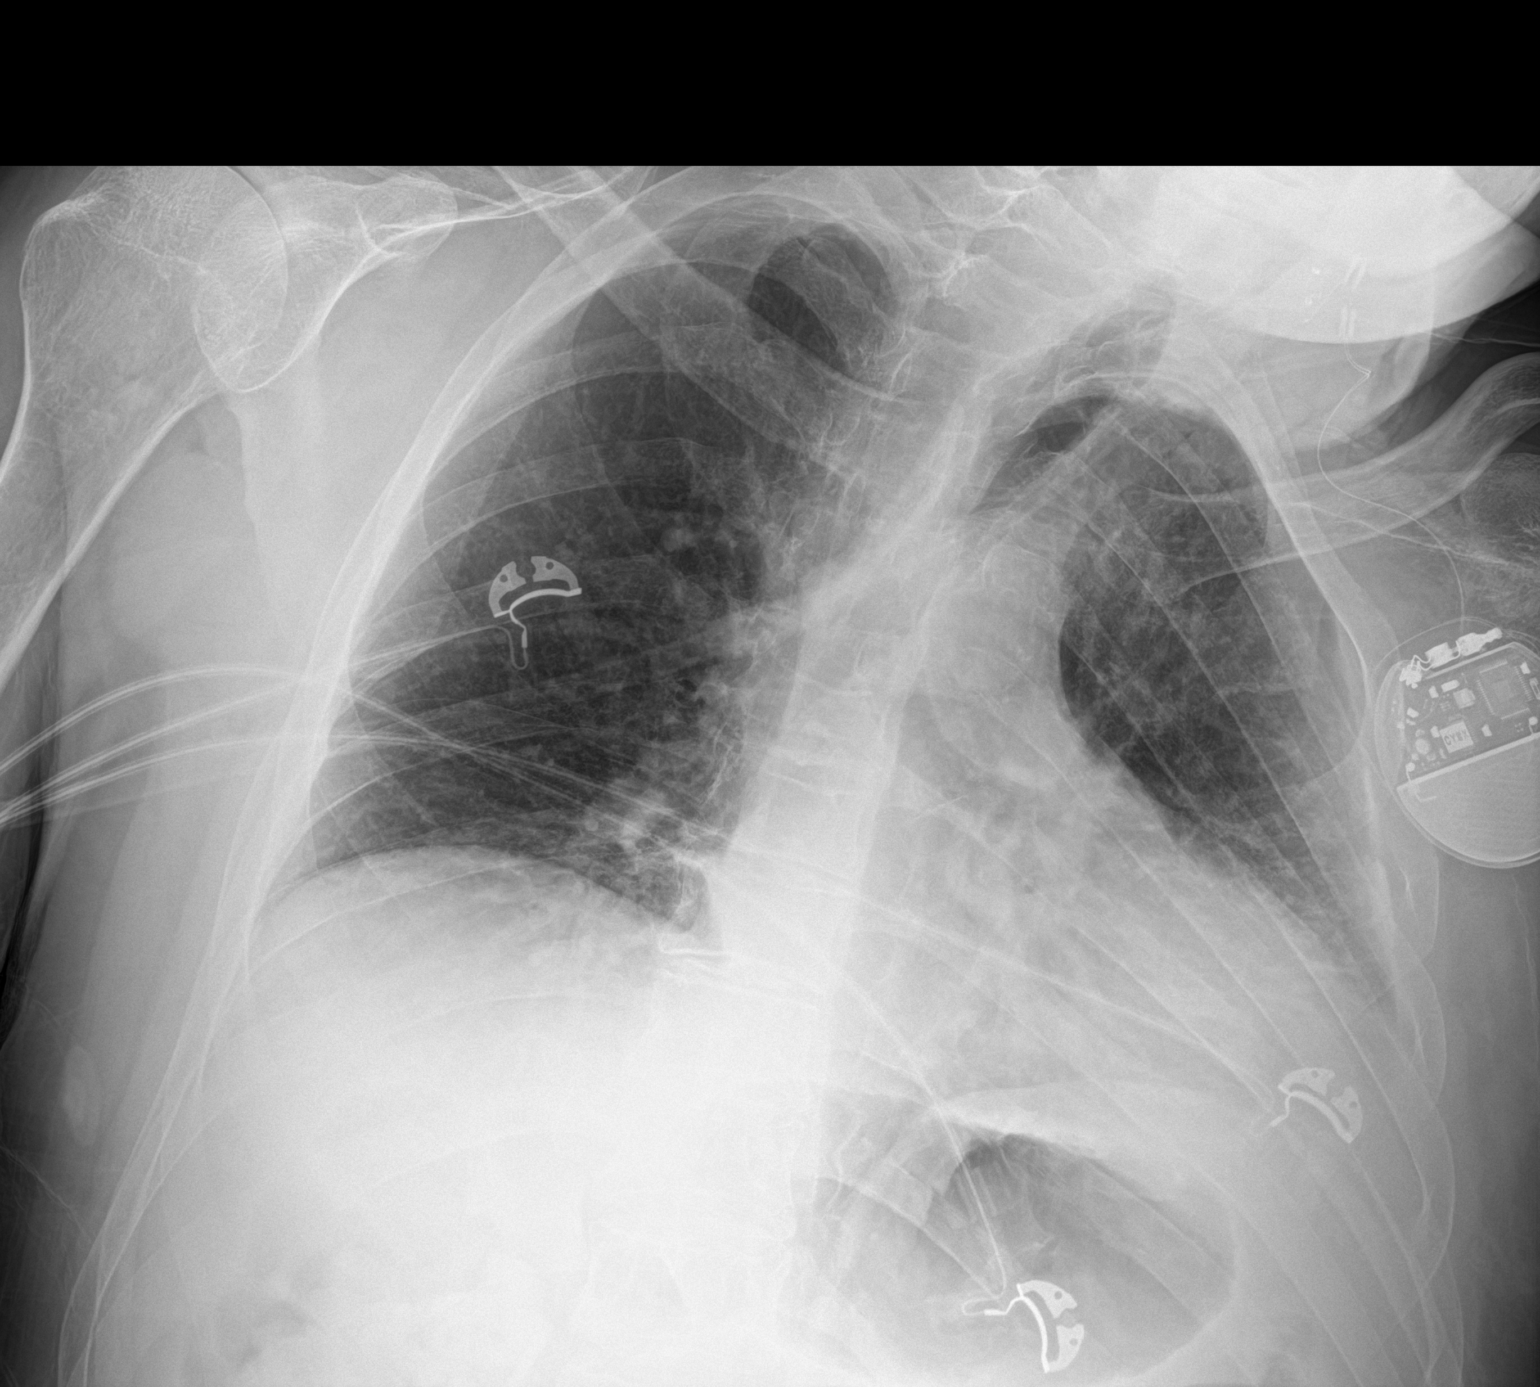

[1 of 1 positions shown; findings below may reference images not displayed]

FINDINGS: Severely rotated to the LEFT.

Neural stimulator extending into LEFT cervical region.

Normal heart size, mediastinal contours, and pulmonary vascularity.

Bibasilar atelectasis.

No definite acute infiltrate, pleural effusion, or pneumothorax.

Osseous structures demineralized.
IMPRESSION: Bibasilar atelectasis.

## 2020-11-19 ENCOUNTER — Other Ambulatory Visit: Payer: Self-pay | Admitting: Neurology

## 2020-11-19 ENCOUNTER — Encounter: Payer: Self-pay | Admitting: Neurology

## 2020-11-19 ENCOUNTER — Ambulatory Visit (INDEPENDENT_AMBULATORY_CARE_PROVIDER_SITE_OTHER): Payer: Medicare Other | Admitting: Neurology

## 2020-11-19 VITALS — BP 126/68 | HR 71

## 2020-11-19 DIAGNOSIS — Z4542 Encounter for adjustment and management of neuropacemaker (brain) (peripheral nerve) (spinal cord): Secondary | ICD-10-CM

## 2020-11-19 DIAGNOSIS — G40011 Localization-related (focal) (partial) idiopathic epilepsy and epileptic syndromes with seizures of localized onset, intractable, with status epilepticus: Secondary | ICD-10-CM

## 2020-11-19 DIAGNOSIS — G40209 Localization-related (focal) (partial) symptomatic epilepsy and epileptic syndromes with complex partial seizures, not intractable, without status epilepticus: Secondary | ICD-10-CM

## 2020-11-19 DIAGNOSIS — Z9689 Presence of other specified functional implants: Secondary | ICD-10-CM

## 2020-11-19 MED ORDER — LEVETIRACETAM ER 750 MG PO TB24: 1500.0000 mg | ORAL_TABLET | Freq: Two times a day (BID) | ORAL | 5 refills | Status: DC

## 2020-11-19 MED ORDER — VALTOCO 20 MG DOSE 10 MG/0.1ML NA LQPK: 1.0000 | NASAL | 3 refills | Status: DC | PRN

## 2020-11-19 MED ORDER — LAMOTRIGINE ER 300 MG PO TB24: ORAL_TABLET | ORAL | 5 refills | Status: DC

## 2020-11-19 MED ORDER — LAMOTRIGINE ER 300 MG PO TB24
ORAL_TABLET | ORAL | 5 refills | Status: DC
Start: 2020-11-19 — End: 2020-11-19

## 2020-11-19 MED ORDER — LACOSAMIDE 200 MG PO TABS: 200.0000 mg | ORAL_TABLET | Freq: Two times a day (BID) | ORAL | 1 refills | Status: DC

## 2020-11-19 MED ORDER — LAMOTRIGINE ER 300 MG PO TB24: 600.0000 mg | ORAL_TABLET | ORAL | 1 refills | Status: DC

## 2020-11-19 MED ORDER — LAMOTRIGINE ER 300 MG PO TB24
600.0000 mg | ORAL_TABLET | ORAL | 1 refills | Status: DC
Start: 2020-11-19 — End: 2020-11-19

## 2020-11-19 NOTE — Progress Notes (Signed)
10/14/2020 settings Normal-0.641mA AutoStim-0.35mA Magnet Mode-0.875 mA  11/19/2020 Increased the settings Normal Mode- 0.48mA Auto Stim 0.886mA Magnet Mode- 21mA

## 2020-11-19 NOTE — Addendum Note (Signed)
Addended by: Darleen Crocker on: 11/19/2020 04:18 PM   Modules accepted: Orders

## 2020-11-19 NOTE — Progress Notes (Signed)
PATIENT: Harry Carr DOB: Feb 01, 1981  REASON FOR VISIT: follow up HISTORY FROM: patient and his mother.   HISTORY OF PRESENT ILLNESS:  11/19/20: Harry Carr  was recently for the first time seizure free for 6 days -  His seizures still occur at 8.30 PM.  Question of medication wearing off. He takes po meds at 9.30 Am and 9 Pm.  Aside from the changes to his VNS stimulator as outlined below the patient was also stimulated here in our presence and did not have any negative side effects or discomfort. Magnet swipe was performed in office. The patient has a slight cough, a clearing of the throat in response to activation.  Voice was not longer hoarse.     I changed those of his seizure medications that are available and extended release 24-hour form to that form and I try to keep with the brand-name only policy.  So this patient will now use Lamictal 300 mg extended release form 24-hour tablets 2 in the morning 600 mg total daily.  Keppra has converted from 3 500 mg tablets to two 24-hour extended release tablets of 750 mg 2 in the morning 2 in the evening but can also be taken once a day.  Vimpat cannot be changed there is no extended release form available at this time.  My goal is to make sure that the medication does not wear off and causes seizures shortly before the second dose intake of the day.  I expect to have to find an medical exemption that Harry Carr has in the past have breakthrough seizures with generic medications and there is clearly a need for brand-name only medication.   10/14/2020 settings Normal-0.675mA AutoStim-0.50mA Magnet Mode-0.875 mA  11/19/2020 Increased the settings Normal Mode- 0.26mA Auto Stim 0.881mA Magnet Mode- 72mA     10-14-2020: [name], RN, is  assisting. [name] on the phone.  Interval History - still having 8-9 seizures a month, mostly at 6-8 Pm, subtherapeutic on current settings. Not lasting as long and no longer having headaches. Not  coughing . Menu: output current up from 0.37 mA - increased new 0.5 mA- final 0.625 mA Magnet 0.635 - increased to 0.75 mA. And increased finally to 0.875 mA AUTO stimulation 0.375- to 0.5 today.- now 0.75  Pulse width 250 micro sec.  On time 30 sec.   Signal on 30 sec. Signal off 5 min.  Swipe magnet-  Step 1 was tolerated , step 2 further up, doing well .magnet swiped- patient reported no discomfort.   We rest 3 or more function in 2 steps each. He stays on oral anticonvulsants as current.  CD       09-08-2020' INTERVAL  HISTORY CD: Harry Carr is a 40 year male, accompanied by his mother, seen in clinic following VNS placement by Dr. Kathyrn Sheriff on Jul 14 2020,  and then activated by Dr. Krista Blue at [facility]. .   Harry Carr is a 40 year old male with a history of intractable seizures and wheelchair bound- .  He returns today for VNS adjustment.  The patient states that since the VNS his episodes have decreased to 2-3 episodes a week. The triggers for this patient are flickering lights, sometimes whole driving and when the son is low, sometimes on TV screen.   They continued to occur between 630 and 9:30 PM but less frequent-  Tolerates the current VNS settings well.   He encountered occassional hoarseness and swallowing difficulties, his mother swipes the magnet  Several times a day. She also noted that he has not had a severe headache after every event.   He remains on Vimpat, Keppra and Lamictal.   Current settings -his VNS is in as tired SR and 106 with the serial #[id number]. The device was activated by Dr Krista Blue on 11.23.2021 in my absence. The output setting is 0.25 mA frequency 20 Hz pulse width 250 s on time 30 seconds off time 5 minutes duty cycle 10%. Auto stimulation setting for an output of 0.375 mA pulse was 250 s on time 30 seconds magnet is set to an output of 0.5 mA same pulse width and on time here 60 seconds.  Generator battery is close to 100%.  We adjusted the outputs setting today  to 0.375 mA this will also change the outer stimulation setting to 0.5 mA and the magnet 206.25 mA. Impedance and on is 4346 ohms, the generator battery close to 100%, the  output current: 0.375 mA  Also set are detection features of 40 tachycardia detection is enabled the threshold for the detection to a response is at 40% increase of heart rate.  Magnet swipe was performed in office. The patient has a slight cough, a clearing of the throat in response to activation.  Voice was hoarse.      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  He is also a current patient of Dr. Doy Mince at [facility] where he receives Botox injections to overcome the hemispasticity.  Seen here today 3 weeks after hospitalization at [facility] for status epilepticus, medications were increased in response to that activity 550 mg of Vimpat twice a day to 200 mg of Vimpat twice a day.  He could not be transferred to [facility] as there were no beds, but and observation.  In the emergency room took place.  Keppra was given 2 g by intravenous infusion and his dose of Lamictal was just continued but not altered.  He received at home nasal spray diazepam which did not shorten the seizure activity in the ED and the IV was established and 1 mg of Ativan was administered a second dose of 1 mg was given with 5 L oxygen by nasal cannula.  The patient began vomiting about 90 minutes after these 2 doses were given complained of a headache and appeared very drowsy he continued to get fluids is also given Toradol and Zofran.  Harry Carr is now 40 years old he has a history of cerebral palsy and seizure disorder and presented to the ED for an evaluation of a new breakthrough seizure 20 minutes prior to arrival by private car the patient had began seizing at home and this exceeded in  duration his usual brief seizures of 10 to 15 seconds duration which she has at least weekly.  At [facility] a CBC with differential was done and the white blood cell count was 13.7 elevated with an absolute lymphocyte count of 6.9K per liter and an absolute monocyte count of 1.2 mL this could have been a viral infection.  However the patient tested negative for Covid and a urine sample which was delivered the next day did not give any evidence of a urinary tract infection.  Liver function and kidney function were normal a protocol of the medications given in the emergency room was printed and reviewed.  However he  has since been taking the higher dose of Vimpat without being sedated or in any way obviously changed.  His primary care physician is Dr. Lillard Anes, MD.  neither the patient nor his mother have been covid vaccinated, claiming their 22 neurologist was hesitant to recommned vaccine in light of Corey's tendency to have  break through seizures. I explained,that a vaccine without a living attenuated virus, such as an mRNA c vaccine , would be unlikely to cause interference.   My goal would be to use a nasal spray with midazolam, rather than Diazepam.   I reviewed seizure documentation from his mother, he is having seizure about every other days, mostly around 7-9pm, despite polypharmacy, taking vimpat 200mg  bid, Lamictal (brand) 100mg  3 tabs bid, keppra 500mg  3 tabs bid, mother describes his seizures as staring spells, looking towards the left side, pausing for few minutes, no generalized clonic seizure recently  He had a history of status epilepticus in August 2000, requiring h hospitalization  He is wheelchair-bound, nonambulatory, enjoys watching ball games,  He received a left vagal nerve stimulator by neurosurgeon Dr. Kathyrn Sheriff on July 14, 2020, incision healing well,  REVIEW OF SYSTEMS: Out of a complete 14 system review of symptoms, the patient complains only of  the following symptoms, and all other reviewed systems are negative.  Better seizure control.  Has dysphagia and dysphonia.     ALLERGIES: No Known Allergies  HOME MEDICATIONS: Outpatient Medications Prior to Visit  Medication Sig Dispense Refill  . aspirin 81 MG tablet Take 81 mg by mouth daily.    . diazePAM, 20 MG Dose, (VALTOCO 20 MG DOSE) 2 x 10 MG/0.1ML LQPK Place 1 spray into the nose as needed (each nostril 1 spray in status epilepticus). 3 each 3  . lacosamide (VIMPAT) 200 MG TABS tablet Take 1 tablet (200 mg total) by mouth 2 (two) times daily. 180 tablet 1  . lamoTRIgine (LAMICTAL) 100 MG tablet BRAND NAME ONLY, 3 tabs 2 times a day (Patient taking differently: Take 300 mg by mouth in the morning and at bedtime. BRAND NAME ONLY,) 180 tablet 11  . levETIRAcetam (KEPPRA) 500 MG tablet Take 3 tablets (1,500 mg total) by mouth 2 (two) times daily. BRAND NAME ONLY 180 tablet 11  . losartan (COZAAR) 100 MG tablet Take 1 tablet (100 mg total) by mouth daily. 30 tablet 6  . Multiple Vitamins-Minerals (MULTIVITAMIN WITH MINERALS) tablet Take 1 tablet by mouth daily.    . vitamin E 45 MG (100 UNITS) capsule Take 500 Units by mouth daily.     No facility-administered medications prior to visit.    PAST MEDICAL HISTORY: Past Medical History:  Diagnosis Date  . Headache   . Hypertension    mild  . Mild mental retardation   . S/P placement of VNS (vagus nerve stimulation) device    placed on 07/14/20  . Seizures ([place])   . 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 dorsal rhizotomy  age 40 yrs  . VAGUS NERVE STIMULATOR INSERTION Left 07/14/2020   Procedure: VAGAL NERVE STIMULATOR IMPLANT;  Surgeon: Consuella Lose, MD;  Location: [facility];  Service: Neurosurgery;  Laterality: Left;  anterior  . WISDOM TOOTH EXTRACTION  03/2010    FAMILY HISTORY: Family History  Problem Relation Age of  Onset  . Heart attack Father   . Heart disease Maternal Grandmother   . Cancer - Other  Maternal Grandfather        lukemia    SOCIAL HISTORY: Social History   Socioeconomic History  . Marital status: Single    Spouse name: Not on file  . Number of children: 0  . Years of education: Not on file  . Highest education level: Not on file  Occupational History  . Not on file  Tobacco Use  . Smoking status: Never Smoker  . Smokeless tobacco: Never Used  Substance and Sexual Activity  . Alcohol use: Never  . Drug use: No  . Sexual activity: Not Currently  Other Topics Concern  . Not on file  Social History Narrative   Right handed. Caffeine none.  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   Social Determinants of Health   Financial Resource Strain: Not on file  Food Insecurity: Not on file  Transportation Needs: Not on file  Physical Activity: Not on file  Stress: Not on file  Social Connections: Not on file  Intimate Partner Violence: Not on file      PHYSICAL EXAM  Vitals:   11/19/20 1445  BP: 126/68  Pulse: 71   There is no height or weight on file to calculate BMI.  Generalized: Well developed, in no acute distress     DIAGNOSTIC DATA (LABS, IMAGING, TESTING) - I reviewed patient records, labs, notes, testing and imaging myself where available.  Lab Results  Component Value Date   WBC 4.1 08/19/2020   HGB 15.3 08/19/2020   HCT 42.5 08/19/2020   MCV 89 08/19/2020   PLT 276 08/19/2020      Component Value Date/Time   NA 140 08/19/2020 1019   K 4.3 08/19/2020 1019   CL 101 08/19/2020 1019   CO2 23 08/19/2020 1019   GLUCOSE 77 08/19/2020 1019   GLUCOSE 99 07/14/2020 0648   BUN 7 08/19/2020 1019   CREATININE 0.88 08/19/2020 1019   CALCIUM 9.7 08/19/2020 1019   PROT 6.5 08/19/2020 1019   ALBUMIN 4.4 08/19/2020 1019   AST 21 08/19/2020 1019   ALT 14 08/19/2020 1019   ALKPHOS 139 (H) 08/19/2020 1019   BILITOT 1.0 08/19/2020 1019    GFRNONAA 109 08/19/2020 1019   GFRNONAA >60 07/14/2020 0648   GFRAA 126 08/19/2020 1019      ASSESSMENT AND PLAN 40 y.o. 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 here with:  1.  Intractable seizures requiring VNS therapy in addition to medication.   The patient will continue on current medication regimen which includes Vimpat 200 mg twice a day, Lamictal 300 mg twice a day and Keppra 1500 mg twice a day. He stays on oral anticonvulsants as current.  CD   Mom did not bring his seizure journal, he had no seizure in 8 days!   VNS : We reset 3 or more function in 2 steps each.  He will follow-up in 2 months for further VNS adjustments wit Dr Brett Fairy, MD.  VNS: AspireSR M106  Generator Serial No. [id number]  10/14/2020 settings Normal-0.614mA AutoStim-0.32mA Magnet Mode-0.875 mA  11/19/2020 Increased the settings Normal Mode- 0.60mA Auto Stim 0.838mA Magnet Mode- 47mA   I spent 20 minutes of face-to-face and non-face-to-face time with patient.  This included previsit chart review, lab review, study review, order entry, electronic health record documentation, patient education.  RV in 2 month  Larey Seat, MD  11/19/2020, 3:14 PM [facility] [address] [place], [place] [address] [phone]

## 2020-12-02 ENCOUNTER — Other Ambulatory Visit: Payer: Self-pay | Admitting: Legal Medicine

## 2020-12-02 MED ORDER — PROCHLORPERAZINE 25 MG RE SUPP: 25.0000 mg | Freq: Two times a day (BID) | RECTAL | 0 refills | Status: DC | PRN

## 2020-12-14 ENCOUNTER — Encounter: Payer: Self-pay | Admitting: Neurology

## 2020-12-14 ENCOUNTER — Other Ambulatory Visit: Payer: Self-pay

## 2020-12-14 ENCOUNTER — Ambulatory Visit (INDEPENDENT_AMBULATORY_CARE_PROVIDER_SITE_OTHER): Payer: Medicare Other | Admitting: Neurology

## 2020-12-14 DIAGNOSIS — G40309 Generalized idiopathic epilepsy and epileptic syndromes, not intractable, without status epilepticus: Secondary | ICD-10-CM | POA: Diagnosis not present

## 2020-12-14 DIAGNOSIS — G40011 Localization-related (focal) (partial) idiopathic epilepsy and epileptic syndromes with seizures of localized onset, intractable, with status epilepticus: Secondary | ICD-10-CM

## 2020-12-14 DIAGNOSIS — G808 Other cerebral palsy: Secondary | ICD-10-CM | POA: Diagnosis not present

## 2020-12-14 DIAGNOSIS — Z4542 Encounter for adjustment and management of neuropacemaker (brain) (peripheral nerve) (spinal cord): Secondary | ICD-10-CM | POA: Diagnosis not present

## 2020-12-14 NOTE — Patient Instructions (Signed)
Vagal Nerve stimulation   Increased duration of seizure freedom, 2 in 14 days, with 14 days of seizure freedom, the longest time in a long time.

## 2020-12-14 NOTE — Progress Notes (Signed)
PATIENT: Harry Carr DOB: 1981/09/04  REASON FOR VISIT: follow up HISTORY FROM: patient and his mother.   HISTORY OF PRESENT ILLNESS:  12/14/20: Harry Carr has recently had only 2 seizures in 14 days, his medication changes were not yet implemented as his slow release medication is used up fb before changing to  ER/ XR form.    Hopefully, we can see those 8 PM seizures completely disappear with a change in medication release form.  Incoming setting somewhere and output current of 0.750 milliimpaired a signal frequency of 20 Hz a pulse width of 250 s, signal on time of 30 seconds and signal of time of 5 minutes.    Magnet is set to an output current of 1000 million.  Pulse was 250 s signal on time 60 seconds or 1 minute.  Outer stimulation current is 0.875 mA with a pulse width of of 250 s and a signal on time of 30 seconds.  Tachycardia detection is 1 and hypersensitivity on level 4 threshold for outer stimulation is a heart rate change of 40%.       Last visit 11-16-2020, the patient's mother mentioned that he was for the first time seizure free for 6 days -  His seizures still occur at 8.30 PM.  Question of medication wearing off. He takes po meds at 9.30 Am and 9 Pm.  Aside from the changes to his VNS stimulator as outlined below the patient was also stimulated here in our presence and did not have any negative side effects or discomfort. Magnet swipe was performed in office. The patient has a slight cough, a clearing of the throat in response to activation.  Voice was not longer hoarse.     I changed those of his seizure medications that are available and extended release 24-hour form to that form and I try to keep with the brand-name only policy.  So this patient will now use Lamictal 300 mg extended release form 24-hour tablets 2 in the morning 600 mg total daily.  Keppra has converted from 3 500 mg tablets to two 24-hour extended release tablets of 750 mg 2 in the morning  2 in the evening but can also be taken once a day.  Vimpat cannot be changed there is no extended release form available at this time.  My goal is to make sure that the medication does not wear off and causes seizures shortly before the second dose intake of the day.  I expect to have to find an medical exemption that Harry Carr has in the past have breakthrough seizures with generic medications and there is clearly a need for brand-name only medication.   10/14/2020 settings Normal-0.677mA AutoStim-0.73mA Magnet Mode-0.875 mA  11/19/2020 Increased the settings Normal Mode- 0.81mA Auto Stim 0.838mA Magnet Mode- 73mA     10-14-2020: [name], RN, is  assisting. [name] on the phone.  Interval History - still having 8-9 seizures a month, mostly at 6-8 Pm, subtherapeutic on current settings. Not lasting as long and no longer having headaches. Not coughing . Menu: output current up from 0.37 mA - increased new 0.5 mA- final 0.625 mA Magnet 0.635 - increased to 0.75 mA. And increased finally to 0.875 mA AUTO stimulation 0.375- to 0.5 today.- now 0.75  Pulse width 250 micro sec.  On time 30 sec.   Signal on 30 sec. Signal off 5 min.  Swipe magnet-  Step 1 was tolerated , step 2 further up, doing well .  magnet swiped- patient reported no discomfort.   We rest 3 or more function in 2 steps each. He stays on oral anticonvulsants as current.  CD       09-08-2020' INTERVAL  HISTORY CD: Harry Carr is a 40 year male, accompanied by his mother, seen in clinic following VNS placement by Dr. Kathyrn Sheriff on Jul 14 2020,  and then activated by Dr. Krista Blue at [facility]. .   Harry Carr is a 40 year old male with a history of intractable seizures and wheelchair bound- .  He returns today for VNS adjustment.  The patient states that since the VNS his episodes have decreased to 2-3 episodes a week. The triggers for this patient are flickering lights, sometimes whole driving and when the son is low, sometimes  on TV screen.   They continued to occur between 630 and 9:30 PM but less frequent-  Tolerates the current VNS settings well.   He encountered occassional hoarseness and swallowing difficulties, his mother swipes the magnet   Several times a day. She also noted that he has not had a severe headache after every event.   He remains on Vimpat, Keppra and Lamictal.   Current settings -his VNS is in as tired SR and 106 with the serial #[id number]. The device was activated by Dr Krista Blue on 11.23.2021 in my absence. The output setting is 0.25 mA frequency 20 Hz pulse width 250 s on time 30 seconds off time 5 minutes duty cycle 10%. Auto stimulation setting for an output of 0.375 mA pulse was 250 s on time 30 seconds magnet is set to an output of 0.5 mA same pulse width and on time here 60 seconds.  Generator battery is close to 100%.  We adjusted the outputs setting today to 0.375 mA this will also change the outer stimulation setting to 0.5 mA and the magnet 206.25 mA. Impedance and on is 4346 ohms, the generator battery close to 100%, the  output current: 0.375 mA  Also set are detection features of 40 tachycardia detection is enabled the threshold for the detection to a response is at 40% increase of heart rate.  Magnet swipe was performed in office. The patient has a slight cough, a clearing of the throat in response to activation.  Voice was hoarse.      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  He is also a current patient of Dr. Doy Mince at [facility] where he receives Botox injections to overcome the hemispasticity.  Seen here today 3 weeks after hospitalization at [facility] for status epilepticus, medications were increased in response to that activity 550 mg of Vimpat twice a day to 200 mg of Vimpat twice a day.  He could not be  transferred to [facility] as there were no beds, but and observation.  In the emergency room took place.  Keppra was given 2 g by intravenous infusion and his dose of Lamictal was just continued but not altered.  He received at home nasal spray diazepam which did not shorten the seizure activity in the ED and the IV was established and 1 mg of Ativan was administered a second dose of 1 mg was given with 5 L oxygen by nasal cannula.  The patient began vomiting about 90 minutes after these 2 doses were given complained of a headache and appeared very drowsy he  continued to get fluids is also given Toradol and Zofran.  [name] is now 40 years old he has a history of cerebral palsy and seizure disorder and presented to the ED for an evaluation of a new breakthrough seizure 20 minutes prior to arrival by private car the patient had began seizing at home and this exceeded in duration his usual brief seizures of 10 to 15 seconds duration which she has at least weekly.  At [facility] a CBC with differential was done and the white blood cell count was 13.7 elevated with an absolute lymphocyte count of 6.9K per liter and an absolute monocyte count of 1.2 mL this could have been a viral infection.  However the patient tested negative for Covid and a urine sample which was delivered the next day did not give any evidence of a urinary tract infection.  Liver function and kidney function were normal a protocol of the medications given in the emergency room was printed and reviewed.  However he has since been taking the higher dose of Vimpat without being sedated or in any way obviously changed.  His primary care physician is Dr. Lillard Anes, MD.  neither the patient nor his mother have been covid vaccinated, claiming their 3 neurologist was hesitant to recommned vaccine in light of Harry Carr tendency to have  break through seizures. I explained,that a vaccine without a living attenuated virus, such as an  mRNA c vaccine , would be unlikely to cause interference.   My goal would be to use a nasal spray with midazolam, rather than Diazepam.   I reviewed seizure documentation from his mother, he is having seizure about every other days, mostly around 7-9pm, despite polypharmacy, taking vimpat 200mg  bid, Lamictal (brand) 100mg  3 tabs bid, keppra 500mg  3 tabs bid, mother describes his seizures as staring spells, looking towards the left side, pausing for few minutes, no generalized clonic seizure recently  He had a history of status epilepticus in August 2000, requiring h hospitalization  He is wheelchair-bound, nonambulatory, enjoys watching ball games,  He received a left vagal nerve stimulator by neurosurgeon Dr. Kathyrn Sheriff on July 14, 2020, incision healing well,  REVIEW OF SYSTEMS: Out of a complete 14 system review of symptoms, the patient complains only of the following symptoms, and all other reviewed systems are negative.  Better seizure control.  Has dysphagia and dysphonia.     ALLERGIES: No Known Allergies  HOME MEDICATIONS: Outpatient Medications Prior to Visit  Medication Sig Dispense Refill  . aspirin 81 MG tablet Take 81 mg by mouth daily.    . diazePAM, 20 MG Dose, (VALTOCO 20 MG DOSE) 2 x 10 MG/0.1ML LQPK Place 1 spray into the nose as needed (each nostril 1 spray in status epilepticus). 3 each 3  . lacosamide (VIMPAT) 200 MG TABS tablet Take 1 tablet (200 mg total) by mouth 2 (two) times daily. 180 tablet 1  . LamoTRIgine 300 MG TB24 24 hour tablet Take 2 tablets (600 mg total) by mouth every morning. 180 tablet 1  . Levetiracetam (KEPPRA XR) 750 MG TB24 Take 2 tablets (1,500 mg total) by mouth in the morning and at bedtime. 120 tablet 5  . losartan (COZAAR) 100 MG tablet Take 1 tablet (100 mg total) by mouth daily. 30 tablet 6  . Multiple Vitamins-Minerals (MULTIVITAMIN WITH MINERALS) tablet Take 1 tablet by mouth daily.    . prochlorperazine (COMPAZINE) 25 MG  suppository Place 1 suppository (25 mg total) rectally every 12 (twelve) hours  as needed for nausea or vomiting. 12 suppository 0  . vitamin E 45 MG (100 UNITS) capsule Take 500 Units by mouth daily.     No facility-administered medications prior to visit.    PAST MEDICAL HISTORY: Past Medical History:  Diagnosis Date  . Headache   . Hypertension    mild  . Mild mental retardation   . S/P placement of VNS (vagus nerve stimulation) device    placed on 07/14/20  . Seizures ([place])   . 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 dorsal rhizotomy  age 63 yrs  . VAGUS NERVE STIMULATOR INSERTION Left 07/14/2020   Procedure: VAGAL NERVE STIMULATOR IMPLANT;  Surgeon: Consuella Lose, MD;  Location: [place];  Service: Neurosurgery;  Laterality: Left;  anterior  . WISDOM TOOTH EXTRACTION  03/2010    FAMILY HISTORY: Family History  Problem Relation Age of Onset  . Heart attack Father   . Heart disease Maternal Grandmother   . Cancer - Other Maternal Grandfather        lukemia    SOCIAL HISTORY: Social History   Socioeconomic History  . Marital status: Single    Spouse name: Not on file  . Number of children: 0  . Years of education: Not on file  . Highest education level: Not on file  Occupational History  . Not on file  Tobacco Use  . Smoking status: Never Smoker  . Smokeless tobacco: Never Used  Substance and Sexual Activity  . Alcohol use: Never  . Drug use: No  . Sexual activity: Not Currently  Other Topics Concern  . Not on file  Social History Narrative   Right handed. Caffeine none.  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   Social Determinants of Health   Financial Resource Strain: Not on file  Food Insecurity: Not on file  Transportation Needs: Not on file  Physical Activity: Not on file  Stress: Not on file  Social Connections: Not on file  Intimate  Partner Violence: Not on file      PHYSICAL EXAM  Vitals:   12/14/20 1323  BP: 111/65  Pulse: 66   There is no height or weight on file to calculate BMI.  Generalized: Well developed, in no acute distress     DIAGNOSTIC DATA (LABS, IMAGING, TESTING) - I reviewed patient records, labs, notes, testing and imaging myself where available.  Lab Results  Component Value Date   WBC 4.1 08/19/2020   HGB 15.3 08/19/2020   HCT 42.5 08/19/2020   MCV 89 08/19/2020   PLT 276 08/19/2020      Component Value Date/Time   NA 140 08/19/2020 1019   K 4.3 08/19/2020 1019   CL 101 08/19/2020 1019   CO2 23 08/19/2020 1019   GLUCOSE 77 08/19/2020 1019   GLUCOSE 99 07/14/2020 0648   BUN 7 08/19/2020 1019   CREATININE 0.88 08/19/2020 1019   CALCIUM 9.7 08/19/2020 1019   PROT 6.5 08/19/2020 1019   ALBUMIN 4.4 08/19/2020 1019   AST 21 08/19/2020 1019   ALT 14 08/19/2020 1019   ALKPHOS 139 (H) 08/19/2020 1019   BILITOT 1.0 08/19/2020 1019   GFRNONAA 109 08/19/2020 1019   GFRNONAA >60 07/14/2020 0648   GFRAA 126 08/19/2020 1019      ASSESSMENT AND PLAN 40 y.o. 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 here with:  1.  Intractable seizures requiring VNS therapy in addition to medication.   The patient will continue on current medication regimen which includes Vimpat 200 mg twice a day, Lamictal 300 mg twice a day and Keppra 1500 mg twice a day. He stays on oral anticonvulsants as current.  CD   Mom did  bring his seizure journal, he had 2 seizure in 14 days!  Each time  At 8 PM.    VNS : We reset none today 12-14-2020,    He will follow-up in 4 months for further VNS adjustments with Dr Brett Fairy, MD, as needed. .  VNS: AspireSR [id number]  Generator Serial No. [id number]  10/14/2020  settings Normal-0.665mA AutoStim-0.51mA Magnet Mode-0.875 mA  11/19/2020 Increased the settings Normal Mode- 0.83mA Auto Stim 0.838mA Magnet Mode- 68m A  We are changing on these last settings , no changes today. Will exchange SR for XR form of medication in same dose.    I spent 20 minutes of face-to-face and non-face-to-face time with patient.  This included previsit chart review, lab review, study review, order entry, electronic health record documentation, patient education.  RV in 4 month  Larey Seat, MD  12/14/2020, 1:47 PM [facility] [address], [place], [place] [address] [phone]

## 2020-12-22 ENCOUNTER — Ambulatory Visit: Payer: Self-pay | Admitting: Neurology

## 2021-01-08 DIAGNOSIS — G808 Other cerebral palsy: Secondary | ICD-10-CM | POA: Diagnosis not present

## 2021-01-27 ENCOUNTER — Other Ambulatory Visit: Payer: Self-pay

## 2021-01-27 ENCOUNTER — Encounter: Payer: Self-pay | Admitting: Legal Medicine

## 2021-01-27 ENCOUNTER — Ambulatory Visit (INDEPENDENT_AMBULATORY_CARE_PROVIDER_SITE_OTHER): Payer: Medicare Other | Admitting: Legal Medicine

## 2021-01-27 DIAGNOSIS — H6123 Impacted cerumen, bilateral: Secondary | ICD-10-CM | POA: Diagnosis not present

## 2021-01-27 DIAGNOSIS — H6120 Impacted cerumen, unspecified ear: Secondary | ICD-10-CM | POA: Insufficient documentation

## 2021-01-27 NOTE — Progress Notes (Signed)
Subjective:  Patient ID: Harry Carr, male    DOB: 1981-06-08  Age: 40 y.o. MRN: [id number]  Chief Complaint  Patient presents with  . Ear Fullness    Right ear     HPI: ear problem, right ear unable to hear out of it.  Started one week.  He has a history of cerumen impaction   Current Outpatient Medications on File Prior to Visit  Medication Sig Dispense Refill  . aspirin 81 MG tablet Take 81 mg by mouth daily.    . diazePAM, 20 MG Dose, (VALTOCO 20 MG DOSE) 2 x 10 MG/0.1ML LQPK Place 1 spray into the nose as needed (each nostril 1 spray in status epilepticus). 3 each 3  . lacosamide (VIMPAT) 200 MG TABS tablet Take 1 tablet (200 mg total) by mouth 2 (two) times daily. 180 tablet 1  . LamoTRIgine 300 MG TB24 24 hour tablet Take 2 tablets (600 mg total) by mouth every morning. 180 tablet 1  . Levetiracetam (KEPPRA XR) 750 MG TB24 Take 2 tablets (1,500 mg total) by mouth in the morning and at bedtime. 120 tablet 5  . losartan (COZAAR) 100 MG tablet Take 1 tablet (100 mg total) by mouth daily. 30 tablet 6  . Multiple Vitamins-Minerals (MULTIVITAMIN WITH MINERALS) tablet Take 1 tablet by mouth daily.    . prochlorperazine (COMPAZINE) 25 MG suppository Place 1 suppository (25 mg total) rectally every 12 (twelve) hours as needed for nausea or vomiting. 12 suppository 0  . vitamin E 45 MG (100 UNITS) capsule Take 500 Units by mouth daily.     No current facility-administered medications on file prior to visit.   Past Medical History:  Diagnosis Date  . Headache   . Hypertension    mild  . Mild mental retardation   . S/P placement of VNS (vagus nerve stimulation) device    placed on 07/14/20  . Seizures ([place])   . 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 dorsal rhizotomy  age 70 yrs  . VAGUS NERVE STIMULATOR INSERTION Left 07/14/2020   Procedure: VAGAL NERVE STIMULATOR IMPLANT;  Surgeon: Consuella Lose, MD;   Location: [place];  Service: Neurosurgery;  Laterality: Left;  anterior  . WISDOM TOOTH EXTRACTION  03/2010    Family History  Problem Relation Age of Onset  . Heart attack Father   . Heart disease Maternal Grandmother   . Cancer - Other Maternal Grandfather        lukemia   Social History   Socioeconomic History  . Marital status: Single    Spouse name: Not on file  . Number of children: 0  . Years of education: Not on file  . Highest education level: Not on file  Occupational History  . Not on file  Tobacco Use  . Smoking status: Never Smoker  . Smokeless tobacco: Never Used  Substance and Sexual Activity  . Alcohol use: Never  . Drug use: No  . Sexual activity: Not Currently  Other Topics Concern  . Not on file  Social History Narrative   Right handed. Caffeine none.  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   Social Determinants of Health   Financial Resource Strain: Not on file  Food Insecurity: Not on file  Transportation Needs: Not on file  Physical Activity: Not on file  Stress: Not on file  Social Connections: Not on file    Review of  Systems  Constitutional: Negative for activity change and appetite change.  HENT: Positive for ear pain and hearing loss (right).   Eyes: Negative for visual disturbance.  Respiratory: Negative for chest tightness and shortness of breath.   Cardiovascular: Negative for chest pain, palpitations and leg swelling.  Gastrointestinal: Negative for abdominal distention and abdominal pain.  Genitourinary: Negative for difficulty urinating and urgency.  Musculoskeletal: Negative for arthralgias and back pain.  Neurological: Positive for seizures.  Psychiatric/Behavioral: Negative.      Objective:  BP 120/80   Pulse 78   Temp 97.7 F (36.5 C)   Resp 16   Ht 5\' 10"  (1.778 m)   Wt 165 lb (74.8 kg)   SpO2 95%   BMI 23.68 kg/m   BP/Weight 01/27/2021 12/14/2020 9/35/5217  Systolic BP 471 595 396  Diastolic BP 80  65 68  Wt. (Lbs) 165 - -  BMI 23.68 - -    Physical Exam Vitals reviewed.  Constitutional:      Appearance: Normal appearance. He is normal weight.  HENT:     Head: Normocephalic.     Right Ear: There is impacted cerumen.     Left Ear: There is impacted cerumen.  Cardiovascular:     Rate and Rhythm: Normal rate and regular rhythm.     Pulses: Normal pulses.     Heart sounds: Normal heart sounds. No murmur heard. No gallop.   Pulmonary:     Effort: Pulmonary effort is normal. No respiratory distress.     Breath sounds: Normal breath sounds. No rales.  Musculoskeletal:        General: Normal range of motion.  Skin:    General: Skin is warm.  Neurological:     Mental Status: He is alert.       Lab Results  Component Value Date   WBC 4.1 08/19/2020   HGB 15.3 08/19/2020   HCT 42.5 08/19/2020   PLT 276 08/19/2020   GLUCOSE 77 08/19/2020   ALT 14 08/19/2020   AST 21 08/19/2020   NA 140 08/19/2020   K 4.3 08/19/2020   CL 101 08/19/2020   CREATININE 0.88 08/19/2020   BUN 7 08/19/2020   CO2 23 08/19/2020   INR 1.0 06/08/2020      Assessment & Plan:   Diagnoses and all orders for this visit: Bilateral impacted cerumen Both ears were lavaged clear and hearing returned       Follow-up: Return in about 3 months (around 04/29/2021).  An After Visit Summary was printed and given to the patient.  Reinaldo Meeker, MD [facility] [phone]

## 2021-02-10 ENCOUNTER — Telehealth: Payer: Self-pay | Admitting: Neurology

## 2021-02-10 NOTE — Telephone Encounter (Signed)
I placed a note on pt's medication list that he requires brand-only seizure medications. Pharmacy had stated they are able to refill for now but future prescriptions should include a note indicating brand only.

## 2021-02-10 NOTE — Telephone Encounter (Signed)
Almyra Free from [facility] called needing to know if it would be ok for the pt to switch to the generic of the lacosamide (VIMPAT) 200 MG TABS tablet. If not they would need prescriptions to say " Can not use Generic" on the top of it. Please advise.

## 2021-02-10 NOTE — Telephone Encounter (Signed)
Per office note, patient has history of breakthrough seizures on generic. He must have brand name only seizure medications. I called [employer] and spoke with Almyra Free. She verbalized understanding that patient should remain on brand name Vimpat.

## 2021-03-10 ENCOUNTER — Other Ambulatory Visit: Payer: Self-pay

## 2021-03-10 DIAGNOSIS — I10 Essential (primary) hypertension: Secondary | ICD-10-CM

## 2021-03-10 MED ORDER — LOSARTAN POTASSIUM 100 MG PO TABS: 100.0000 mg | ORAL_TABLET | Freq: Every day | ORAL | 6 refills | Status: DC

## 2021-03-16 ENCOUNTER — Encounter: Payer: Self-pay | Admitting: Neurology

## 2021-03-16 ENCOUNTER — Ambulatory Visit (INDEPENDENT_AMBULATORY_CARE_PROVIDER_SITE_OTHER): Payer: Medicare Other | Admitting: Neurology

## 2021-03-16 VITALS — BP 133/80 | HR 74

## 2021-03-16 DIAGNOSIS — Z9689 Presence of other specified functional implants: Secondary | ICD-10-CM

## 2021-03-16 DIAGNOSIS — G40011 Localization-related (focal) (partial) idiopathic epilepsy and epileptic syndromes with seizures of localized onset, intractable, with status epilepticus: Secondary | ICD-10-CM

## 2021-03-16 NOTE — Patient Instructions (Signed)
We adjusted today's output from 8.75 2.875 mA auto stimulation output to 1 mA Magnet output to 1.125 mA frequency is 20 Hz pulse width has stayed at 250 s for all sleep components on time 30 seconds for auto stimulation and 60 seconds for magnet the above time is 5 minutes the duty cycle is 10%. Auto stimulation dropped form 40% to 30 %.  This were 4 changes , [id number].

## 2021-03-16 NOTE — Progress Notes (Signed)
PATIENT: Harry Carr DOB: 1981-04-22  REASON FOR VISIT: follow up HISTORY FROM: patient and his mother.   HISTORY OF PRESENT ILLNESS:  03/16/21:   Rv 03-16-2021,  Harry Carr's seizure activity a has just increased again, the timing of medication has not influenced the most common seizure onset time. We adjusted today's output from 8.75 2.875 mA auto stimulation output to 1 mA Magnet output to 1.125 mA frequency is 20 Hz pulse width has stayed at 250 s for all sleep components on time 30 seconds for auto stimulation and 60 seconds for magnet the above time is 5 minutes the duty cycle is 10%. Auto stimulation dropped form 40% to 30 %.  This were 4 changes , [id number]. Cover he remembers one onset of a seizure in a restaurant to at that time he could feel that the VNS stimulator was activating.  The last system interrogation was on 19 November 2020.  The last magnetic event 15 times timestamp June 19 June 20 is 23rd-25th 28th July 1 and July 3.    12-14-2020: VNS. Harry Carr has recently had only 2 seizures in 14 days, his medication changes were not yet implemented as his slow release medication is used up fb before changing to  ER/ XR form.    Hopefully, we can see those 8 PM seizures completely disappear with a change in medication release form.  Incoming setting somewhere and output current of 0.750 milliimpaired a signal frequency of 20 Hz a pulse width of 250 s, signal on time of 30 seconds and signal of time of 5 minutes.    Magnet is set to an output current of 1000 million.  Pulse was 250 s signal on time 60 seconds or 1 minute.  Outer stimulation current is 0.875 mA with a pulse width of of 250 s and a signal on time of 30 seconds.  Tachycardia detection is 1 and hypersensitivity on level 4 threshold for outer stimulation is a heart rate change of 40%.       Last visit 11-16-2020, the patient's mother mentioned that he was for the first time seizure free for 6 days -  His  seizures still occur at 8.30 PM.  Question of medication wearing off. He takes po meds at 9.30 Am and 9 Pm.  Aside from the changes to his VNS stimulator as outlined below the patient was also stimulated here in our presence and did not have any negative side effects or discomfort. Magnet swipe was performed in office. The patient has a slight cough, a clearing of the throat in response to activation.  Voice was not longer hoarse.     I changed those of his seizure medications that are available and extended release 24-hour form to that form and I try to keep with the brand-name only policy.  So this patient will now use Lamictal 300 mg extended release form 24-hour tablets 2 in the morning 600 mg total daily.  Keppra has converted from 3 500 mg tablets to two 24-hour extended release tablets of 750 mg 2 in the morning 2 in the evening but can also be taken once a day.  Vimpat cannot be changed there is no extended release form available at this time.  My goal is to make sure that the medication does not wear off and causes seizures shortly before the second dose intake of the day.  I expect to have to find an medical exemption that Harry Carr has in the  past have breakthrough seizures with generic medications and there is clearly a need for brand-name only medication.   10/14/2020 settings Normal-0.647mA AutoStim-0.79mA Magnet Mode-0.875 mA   11/19/2020 Increased the settings Normal Mode- 0.4mA Auto Stim 0.861mA Magnet Mode- 29mA     10-14-2020: [name], RN, is  assisting. Harry Carr on the phone.  Interval History - still having 8-9 seizures a month, mostly at 6-8 Pm, subtherapeutic on current settings. Not lasting as long and no longer having headaches. Not coughing . Menu: output current up from 0.37 mA - increased new 0.5 mA- final 0.625 mA Magnet 0.635 - increased to 0.75 mA. And increased finally to 0.875 mA AUTO stimulation 0.375- to 0.5 today.- now 0.75  Pulse width 250 micro  sec.  On time 30 sec.   Signal on 30 sec. Signal off 5 min.  Swipe magnet-  Step 1 was tolerated , step 2 further up, doing well .magnet swiped- patient reported no discomfort.   We rest 3 or more function in 2 steps each. He stays on oral anticonvulsants as current.  CD       09-08-2020' INTERVAL  HISTORY CD: Harry Carr is a 40 year male, accompanied by his mother, seen in clinic following VNS placement by Dr. Kathyrn Sheriff on Jul 14 2020,  and then activated by Dr. Krista Blue at [facility]. . We are changing on these last settings , no changes today. Will exchange SR for XR form of medication in same dose.   Harry Carr is a 40 year old male with a history of intractable seizures and wheelchair bound- .  He returns today for VNS adjustment.  The patient states that since the VNS his episodes have decreased to 2-3 episodes a week. The triggers for this patient are flickering lights, sometimes whole driving and when the son is low, sometimes on TV screen.   They continued to occur between 630 and 9:30 PM but less frequent-  Tolerates the current VNS settings well.   He encountered occassional hoarseness and swallowing difficulties, his mother swipes the magnet   Several times a day. She also noted that he has not had a severe headache after every event.   He remains on Vimpat, Keppra and Lamictal.   Current settings -his VNS is in as tired SR and 106 with the serial #[id number]. The device was activated by Dr Krista Blue on 11.23.2021 in my absence. The output setting is 0.25 mA frequency 20 Hz pulse width 250 s on time 30 seconds off time 5 minutes duty cycle 10%. Auto stimulation setting for an output of 0.375 mA pulse was 250 s on time 30 seconds magnet is set to an output of 0.5 mA same pulse width and on time here 60 seconds.  Generator battery is close to 100%.  We adjusted the outputs setting today to 0.375 mA this will also change the outer stimulation setting to 0.5 mA and the magnet 206.25 mA. Impedance and on  is 4346 ohms, the generator battery close to 100%, the  output current: 0.375 mA  Also set are detection features of 40 tachycardia detection is enabled the threshold for the detection to a response is at 40% increase of heart rate.  Magnet swipe was performed in office. The patient has a slight cough, a clearing of the throat in response to activation.  Voice was hoarse.       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  He is also a current patient of Dr. Doy Mince at [facility] where he receives Botox injections to overcome the hemispasticity.  Seen here today 3 weeks after hospitalization at [facility] for status epilepticus, medications were increased in response to that activity 550 mg of Vimpat twice a day to 200 mg of Vimpat twice a day.  He could not be transferred to [facility] as there were no beds, but and observation.  In the emergency room took place.  Keppra was given 2 g by intravenous infusion and his dose of Lamictal was just continued but not altered.  He received at home nasal spray diazepam which did not shorten the seizure activity in the ED and the IV was established and 1 mg of Ativan was administered a second dose of 1 mg was given with 5 L oxygen by nasal cannula.  The patient began vomiting about 90 minutes after these 2 doses were given complained of a headache and appeared very drowsy he continued to get fluids is also given Toradol and Zofran.  [name] is now 40 years old he has a history of cerebral palsy and seizure disorder and presented to the ED for an evaluation of a new breakthrough seizure 20 minutes prior to arrival by private car the patient had began seizing at home and this exceeded in duration his usual brief seizures of 10 to 15 seconds duration which she has at least weekly.  At [facility] a CBC  with differential was done and the white blood cell count was 13.7 elevated with an absolute lymphocyte count of 6.9K per liter and an absolute monocyte count of 1.2 mL this could have been a viral infection.  However the patient tested negative for Covid and a urine sample which was delivered the next day did not give any evidence of a urinary tract infection.  Liver function and kidney function were normal a protocol of the medications given in the emergency room was printed and reviewed.  However he has since been taking the higher dose of Vimpat without being sedated or in any way obviously changed.  His primary care physician is Dr. Lillard Anes, MD.  neither the patient nor his mother have been covid vaccinated, claiming their 86 neurologist was hesitant to recommned vaccine in light of Corey's tendency to have  break through seizures. I explained,that a vaccine without a living attenuated virus, such as an mRNA c vaccine , would be unlikely to cause interference.   My goal would be to use a nasal spray with midazolam, rather than Diazepam.    I reviewed seizure documentation from his mother, he is having seizure about every other days, mostly around 7-9pm, despite polypharmacy, taking vimpat 200mg  bid, Lamictal (brand) 100mg  3 tabs bid, keppra 500mg  3 tabs bid, mother describes his seizures as staring spells, looking towards the left side, pausing for few minutes, no generalized clonic seizure recently   He had a history of status epilepticus in August 2000, requiring h hospitalization   He is wheelchair-bound, nonambulatory, enjoys watching ball games,   He received a left vagal nerve stimulator by neurosurgeon Dr. Kathyrn Sheriff on July 14, 2020, incision healing well,  REVIEW OF SYSTEMS: Out of a complete 14 system review of symptoms, the patient complains only of the following symptoms, and all other reviewed systems are negative.  Better seizure control.  Has dysphagia and  dysphonia.     ALLERGIES:  No Known Allergies  HOME MEDICATIONS: Outpatient Medications Prior to Visit  Medication Sig Dispense Refill   aspirin 81 MG tablet Take 81 mg by mouth daily.     diazePAM, 20 MG Dose, (VALTOCO 20 MG DOSE) 2 x 10 MG/0.1ML LQPK Place 1 spray into the nose as needed (each nostril 1 spray in status epilepticus). 3 each 3   lacosamide (VIMPAT) 200 MG TABS tablet Take 1 tablet (200 mg total) by mouth 2 (two) times daily. 180 tablet 1   LamoTRIgine 300 MG TB24 24 hour tablet Take 2 tablets (600 mg total) by mouth every morning. 180 tablet 1   Levetiracetam (KEPPRA XR) 750 MG TB24 Take 2 tablets (1,500 mg total) by mouth in the morning and at bedtime. 120 tablet 5   losartan (COZAAR) 100 MG tablet Take 1 tablet (100 mg total) by mouth daily. 30 tablet 6   Multiple Vitamins-Minerals (MULTIVITAMIN WITH MINERALS) tablet Take 1 tablet by mouth daily.     prochlorperazine (COMPAZINE) 25 MG suppository Place 1 suppository (25 mg total) rectally every 12 (twelve) hours as needed for nausea or vomiting. 12 suppository 0   vitamin E 45 MG (100 UNITS) capsule Take 500 Units by mouth daily.     No facility-administered medications prior to visit.    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[facility])     PAST SURGICAL HISTORY: Past Surgical History:  Procedure Laterality Date   dorsal rhizotomy  age 73 yrs   VAGUS NERVE STIMULATOR INSERTION Left 07/14/2020   Procedure: VAGAL NERVE STIMULATOR IMPLANT;  Surgeon: Consuella Lose, MD;  Location: [place];  Service: Neurosurgery;  Laterality: Left;  anterior   WISDOM TOOTH EXTRACTION  03/2010    FAMILY HISTORY: Family History  Problem Relation Age of Onset   Heart attack Father    Heart disease  Maternal Grandmother    Cancer - Other Maternal Grandfather        lukemia    SOCIAL HISTORY: Social History   Socioeconomic History   Marital status: Single    Spouse name: Not on file   Number of children: 0   Years of education: Not on file   Highest education level: Not on file  Occupational History   Not on file  Tobacco Use   Smoking status: Never   Smokeless tobacco: Never  Substance and Sexual Activity   Alcohol use: Never   Drug use: No   Sexual activity: Not Currently  Other Topics Concern   Not on file  Social History Narrative   Right handed. Caffeine none.  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   Social Determinants of Health   Financial Resource Strain: Not on file  Food Insecurity: Not on file  Transportation Needs: Not on file  Physical Activity: Not on file  Stress: Not on file  Social Connections: Not on file  Intimate Partner Violence: Not on file      PHYSICAL EXAM  There were no vitals filed for this visit.  There is no height or weight on file to calculate BMI.  Generalized: Well developed, in no acute distress .  Wheelchair bound,  hemiplegia,  Spasticity over left  side, leg and arm,  hand is curled.  CN, facial hemi-spasticity, abnormal limitatio / Rotation of the neck, eye movement is limited, right abducens paresis.         DIAGNOSTIC DATA (LABS, IMAGING, TESTING) - I reviewed patient records, labs, notes, testing and imaging myself where available.  Lab Results  Component Value Date   WBC 4.1 08/19/2020   HGB 15.3 08/19/2020   HCT 42.5 08/19/2020   MCV 89 08/19/2020   PLT 276 08/19/2020      Component Value Date/Time   NA 140 08/19/2020 1019   K 4.3 08/19/2020 1019   CL 101 08/19/2020 1019   CO2 23 08/19/2020 1019   GLUCOSE 77 08/19/2020 1019   GLUCOSE 99 07/14/2020 0648   BUN 7 08/19/2020 1019   CREATININE 0.88 08/19/2020 1019   CALCIUM 9.7 08/19/2020 1019   PROT 6.5 08/19/2020 1019   ALBUMIN  4.4 08/19/2020 1019   AST 21 08/19/2020 1019   ALT 14 08/19/2020 1019   ALKPHOS 139 (H) 08/19/2020 1019   BILITOT 1.0 08/19/2020 1019   GFRNONAA 109 08/19/2020 1019   GFRNONAA >60 07/14/2020 0648   GFRAA 126 08/19/2020 1019      ASSESSMENT AND PLAN 40 y.o. 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 here with:  1.  Intractable seizures requiring VNS therapy in addition to medication.   The patient will continue on current medication regimen which includes Vimpat 200 mg twice a day, Lamictal 300 mg twice a day and Keppra 1500 mg twice a day. He stays on oral anticonvulsants as current.  CD   Mom did  bring his seizure journal, he had 2 seizure in 14 days!  Each time  At 8 PM.    VNS : We reset none today 12-14-2020,    He will follow-up in 4 months for further VNS adjustments with Dr Brett Fairy, MD, as needed. .  VNS: AspireSR [id number]   Generator Serial No. [id number]  10/14/2020 settings Normal-0.625mA AutoStim-0.108mA Magnet Mode-0.875 mA   11/19/2020 Increased the settings Normal Mode- 0.9mA Auto Stim 0.860mA Magnet Mode- 80m A  03-16-2021, We adjusted today's output from 8.75 2.875 mA auto stimulation output to 1 mA Magnet output to 1.125 mA frequency is 20 Hz pulse width has stayed at 250 s for all sleep components on time 30 seconds for auto stimulation and 60 seconds for magnet the above time is 5 minutes the duty cycle is 10%. Auto stimulation dropped form 40% to 30 %.  This were 4 changes to the patient's CN X implanted VNS stimulator , [id number].   I spent 20 minutes of face-to-face and non-face-to-face time with patient and his mother, the VNS technician was present as well.  .  This included previsit chart review, lab review, study review, order entry, electronic health record documentation, patient  education.  RV in 3-4 month  Larey Seat, MD  03/16/2021, 2:35 PM [facility] [address] [place], [place] [address] [phone]

## 2021-04-16 DIAGNOSIS — G808 Other cerebral palsy: Secondary | ICD-10-CM | POA: Diagnosis not present

## 2021-05-03 ENCOUNTER — Other Ambulatory Visit: Payer: Self-pay

## 2021-05-03 MED ORDER — LAMOTRIGINE ER 300 MG PO TB24
600.0000 mg | ORAL_TABLET | ORAL | 1 refills | Status: DC
Start: 2021-05-03 — End: 2021-09-09

## 2021-05-03 MED ORDER — LEVETIRACETAM ER 750 MG PO TB24: 1500.0000 mg | ORAL_TABLET | Freq: Two times a day (BID) | ORAL | 5 refills | Status: DC

## 2021-05-03 MED ORDER — LAMOTRIGINE ER 300 MG PO TB24
600.0000 mg | ORAL_TABLET | ORAL | 1 refills | Status: DC
Start: 2021-05-03 — End: 2021-05-03

## 2021-05-31 ENCOUNTER — Other Ambulatory Visit: Payer: Self-pay | Admitting: Neurology

## 2021-05-31 ENCOUNTER — Other Ambulatory Visit: Payer: Self-pay

## 2021-05-31 MED ORDER — LACOSAMIDE 200 MG PO TABS: 200.0000 mg | ORAL_TABLET | Freq: Two times a day (BID) | ORAL | 1 refills | Status: DC

## 2021-06-01 MED ORDER — LACOSAMIDE 200 MG PO TABS: 200.0000 mg | ORAL_TABLET | Freq: Two times a day (BID) | ORAL | 1 refills | Status: DC

## 2021-06-16 ENCOUNTER — Ambulatory Visit (INDEPENDENT_AMBULATORY_CARE_PROVIDER_SITE_OTHER): Payer: Medicare Other | Admitting: Neurology

## 2021-06-16 VITALS — BP 102/63 | HR 68 | Wt 164.5 lb

## 2021-06-16 DIAGNOSIS — G249 Dystonia, unspecified: Secondary | ICD-10-CM | POA: Diagnosis not present

## 2021-06-16 DIAGNOSIS — G40011 Localization-related (focal) (partial) idiopathic epilepsy and epileptic syndromes with seizures of localized onset, intractable, with status epilepticus: Secondary | ICD-10-CM | POA: Diagnosis not present

## 2021-06-16 DIAGNOSIS — G40209 Localization-related (focal) (partial) symptomatic epilepsy and epileptic syndromes with complex partial seizures, not intractable, without status epilepticus: Secondary | ICD-10-CM | POA: Diagnosis not present

## 2021-06-16 DIAGNOSIS — Z9689 Presence of other specified functional implants: Secondary | ICD-10-CM | POA: Diagnosis not present

## 2021-06-16 NOTE — Progress Notes (Signed)
PATIENT: Harry Carr DOB: Jan 21, 1981  REASON FOR VISIT: follow up HISTORY FROM: patient and his mother.   HISTORY OF PRESENT ILLNESS:  06/16/21: Rm 12, with mom, Here for 3 month VNS f/u. Pt reports still having seizures but they are shorter. There are times they can go 6 days without and then there could be a week where he has several in the week.  VNS:  we adjusted his settings once more: reports less frequent and more brief spells, but still occurring at the witching hour of 8-9 PM.   Rv 03-16-2021,  Harry Carr's seizure activity a has just increased again, the timing of medication has not influenced the most common seizure onset time. We adjusted today's output from 8.75 2.875 mA auto stimulation output to 1 mA Magnet output to 1.125 mA frequency is 20 Hz pulse width has stayed at 250 s for all sleep components on time 30 seconds for auto stimulation and 60 seconds for magnet the above time is 5 minutes the duty cycle is 10%. Auto stimulation dropped form 40% to 30 %.  This were 4 changes , [id number]. Cover he remembers one onset of a seizure in a restaurant to at that time he could feel that the VNS stimulator was activating.  The last system interrogation was on 19 November 2020.  The last magnetic event 15 times timestamp June 19 June 20 is 23rd-25th 28th July 1 and July 3.    12-14-2020: VNS. Harry Carr has recently had only 2 seizures in 14 days, his medication changes were not yet implemented as his slow release medication is used up fb before changing to  ER/ XR form.    Hopefully, we can see those 8 PM seizures completely disappear with a change in medication release form.  Incoming setting somewhere and output current of 0.750 milliimpaired a signal frequency of 20 Hz a pulse width of 250 s, signal on time of 30 seconds and signal of time of 5 minutes.    Magnet is set to an output current of 1000 million.  Pulse was 250 s signal on time 60 seconds or 1 minute.  Outer stimulation  current is 0.875 mA with a pulse width of of 250 s and a signal on time of 30 seconds.  Tachycardia detection is 1 and hypersensitivity on level 4 threshold for outer stimulation is a heart rate change of 40%.       Last visit 11-16-2020, the patient's mother mentioned that he was for the first time seizure free for 6 days -  His seizures still occur at 8.30 PM.  Question of medication wearing off. He takes po meds at 9.30 Am and 9 Pm. Aside from the changes to his VNS stimulator as outlined below the patient was also stimulated here in our presence and did not have any negative side effects or discomfort. Magnet swipe was performed in office. The patient has a slight cough, a clearing of the throat in response to activation.  Voice was not longer hoarse.  I changed those of his seizure medications that are available and extended release 24-hour form to that form and I try to keep with the brand-name only policy.  So this patient will now use Lamictal 300 mg extended release form 24-hour tablets 2 in the morning 600 mg total daily.  Keppra has converted from 3 500 mg tablets to two 24-hour extended release tablets of 750 mg 2 in the morning 2 in the evening but  can also be taken once a day.  Vimpat cannot be changed there is no extended release form available at this time.  My goal is to make sure that the medication does not wear off and causes seizures shortly before the second dose intake of the day.  I expect to have to find an medical exemption that Harry Carr has in the past have breakthrough seizures with generic medications and there is clearly a need for brand-name only medication.   10/14/2020 settings Normal-0.668mA AutoStim-0.74mA Magnet Mode-0.875 mA   11/19/2020 Increased the settings Normal Mode- 0.58mA Auto Stim 0.830mA Magnet Mode- 30mA     10-14-2020: Harry Legacy, RN, is  assisting. Harry Carr on the phone.  Interval History - still having 8-9 seizures a month, mostly at  6-8 Pm, subtherapeutic on current settings. Not lasting as long and no longer having headaches. Not coughing . Menu: output current up from 0.37 mA - increased new 0.5 mA- final 0.625 mA Magnet 0.635 - increased to 0.75 mA. And increased finally to 0.875 mA AUTO stimulation 0.375- to 0.5 today.- now 0.75  Pulse width 250 micro sec.  On time 30 sec.   Signal on 30 sec. Signal off 5 min.  Swipe magnet-  Step 1 was tolerated , step 2 further up, doing well .magnet swiped- patient reported no discomfort.   We rest 3 or more function in 2 steps each. He stays on oral anticonvulsants as current.  CD       09-08-2020' INTERVAL  HISTORY CD: Harry Carr is a 40 year male, accompanied by his mother, seen in clinic following VNS placement by Dr. Kathyrn Carr on Jul 14 2020,  and then activated by Dr. Krista [facility] at [facility]. . We are changing on these last settings , no changes today. Will exchange SR for XR form of medication in same dose.   Harry Carr is a 40 year old male with a history of intractable seizures and wheelchair bound- .  He returns today for VNS adjustment.  The patient states that since the VNS his episodes have decreased to 2-3 episodes a week. The triggers for this patient are flickering lights, sometimes whole driving and when the son is low, sometimes on TV screen.   They continued to occur between 630 and 9:30 PM but less frequent-  Tolerates the current VNS settings well.   He encountered occassional hoarseness and swallowing difficulties, his mother swipes the magnet   Several times a day. She also noted that he has not had a severe headache after every event.   He remains on Vimpat, Keppra and Lamictal.   Current settings -his VNS is in as tired SR and 106 with the serial #[id number]. The device was activated by Dr Harry Carr on 11.23.2021 in my absence. The output setting is 0.25 mA frequency 20 Hz pulse width 250 s on time 30 seconds off time 5 minutes duty cycle 10%. Auto stimulation setting for an  output of 0.375 mA pulse was 250 s on time 30 seconds magnet is set to an output of 0.5 mA same pulse width and on time here 60 seconds.  Generator battery is close to 100%.  We adjusted the outputs setting today to 0.375 mA this will also change the outer stimulation setting to 0.5 mA and the magnet 206.25 mA. Impedance and on is 4346 ohms, the generator battery close to 100%, the  output current: 0.375 mA  Also set are detection features of 40 tachycardia detection is enabled the threshold  for the detection to a response is at 40% increase of heart rate.  Magnet swipe was performed in office. The patient has a slight cough, a clearing of the throat in response to activation.  Voice was hoarse.       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  He is also a current patient of Dr. Doy Mince at [facility] where he receives Botox injections to overcome the hemispasticity.  Seen here today 3 weeks after hospitalization at [facility] for status epilepticus, medications were increased in response to that activity 550 mg of Vimpat twice a day to 200 mg of Vimpat twice a day.  He could not be transferred to [facility] as there were no beds, but and observation.  In the emergency room took place.  Keppra was given 2 g by intravenous infusion and his dose of Lamictal was just continued but not altered.  He received at home nasal spray diazepam which did not shorten the seizure activity in the ED and the IV was established and 1 mg of Ativan was administered a second dose of 1 mg was given with 5 L oxygen by nasal cannula.  The patient began vomiting about 90 minutes after these 2 doses were given complained of a headache and appeared very drowsy he continued to get fluids is also given Toradol and Zofran.  [name] is now 40 years old he has a history of  cerebral palsy and seizure disorder and presented to the ED for an evaluation of a new breakthrough seizure 20 minutes prior to arrival by private car the patient had began seizing at home and this exceeded in duration his usual brief seizures of 10 to 15 seconds duration which she has at least weekly.  At [facility] a CBC with differential was done and the white blood cell count was 13.7 elevated with an absolute lymphocyte count of 6.9K per liter and an absolute monocyte count of 1.2 mL this could have been a viral infection.  However the patient tested negative for Covid and a urine sample which was delivered the next day did not give any evidence of a urinary tract infection.  Liver function and kidney function were normal a protocol of the medications given in the emergency room was printed and reviewed.  However he has since been taking the higher dose of Vimpat without being sedated or in any way obviously changed.  His primary care physician is Dr. Lillard Anes, MD.  neither the patient nor his mother have been covid vaccinated, claiming their 79 neurologist was hesitant to recommned vaccine in light of Harry Carr's tendency to have  break through seizures. I explained,that a vaccine without a living attenuated virus, such as an mRNA c vaccine , would be unlikely to cause interference.   My goal would be to use a nasal spray with midazolam, rather than Diazepam.    I reviewed seizure documentation from his mother, he is having seizure about every other days, mostly around 7-9pm, despite polypharmacy, taking vimpat 200mg  bid, Lamictal (brand) 100mg  3 tabs bid, keppra 500mg  3 tabs bid, mother describes his seizures as staring spells, looking towards the left side, pausing for few minutes, no generalized clonic seizure recently   He had a history of status epilepticus in August 2000, requiring h hospitalization   He is wheelchair-bound, nonambulatory, enjoys watching ball  games,   He  received a left vagal nerve stimulator by neurosurgeon Dr. Kathyrn Carr on July 14, 2020, incision healing well,  REVIEW OF SYSTEMS: Out of a complete 14 system review of symptoms, the patient complains only of the following symptoms, and all other reviewed systems are negative.  Better seizure control.  Has dysphagia and dysphonia.     ALLERGIES: No Known Allergies  HOME MEDICATIONS: Outpatient Medications Prior to Visit  Medication Sig Dispense Refill   aspirin 81 MG tablet Take 81 mg by mouth daily.     diazePAM, 20 MG Dose, (VALTOCO 20 MG DOSE) 2 x 10 MG/0.1ML LQPK Place 1 spray into the nose as needed (each nostril 1 spray in status epilepticus). 3 each 3   lacosamide (VIMPAT) 200 MG TABS tablet Take 1 tablet (200 mg total) by mouth 2 (two) times daily. 180 tablet 1   LamoTRIgine 300 MG TB24 24 hour tablet Take 2 tablets (600 mg total) by mouth every morning. 180 tablet 1   Levetiracetam (KEPPRA XR) 750 MG TB24 Take 2 tablets (1,500 mg total) by mouth in the morning and at bedtime. 120 tablet 5   losartan (COZAAR) 100 MG tablet Take 1 tablet (100 mg total) by mouth daily. 30 tablet 6   Multiple Vitamins-Minerals (MULTIVITAMIN WITH MINERALS) tablet Take 1 tablet by mouth daily.     prochlorperazine (COMPAZINE) 25 MG suppository Place 1 suppository (25 mg total) rectally every 12 (twelve) hours as needed for nausea or vomiting. 12 suppository 0   vitamin E 45 MG (100 UNITS) capsule Take 500 Units by mouth daily.     No facility-administered medications prior to visit.    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)     PAST SURGICAL HISTORY: Past Surgical History:  Procedure Laterality Date   dorsal rhizotomy  age 42 yrs   VAGUS NERVE  STIMULATOR INSERTION Left 07/14/2020   Procedure: VAGAL NERVE STIMULATOR IMPLANT;  Surgeon: Consuella Lose, MD;  Location: [facility];  Service: Neurosurgery;  Laterality: Left;  anterior   WISDOM TOOTH EXTRACTION  03/2010    FAMILY HISTORY: Family History  Problem Relation Age of Onset   Heart attack Father    Heart disease Maternal Grandmother    Cancer - Other Maternal Grandfather        lukemia    SOCIAL HISTORY: Social History   Socioeconomic History   Marital status: Single    Spouse name: Not on file   Number of children: 0   Years of education: Not on file   Highest education level: Not on file  Occupational History   Not on file  Tobacco Use   Smoking status: Never   Smokeless tobacco: Never  Substance and Sexual Activity   Alcohol use: Never   Drug use: No   Sexual activity: Not Currently  Other Topics Concern   Not on file  Social History Narrative   Right handed. Caffeine none.  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   Social Determinants of Health   Financial Resource Strain: Not on file  Food Insecurity: Not on file  Transportation Needs: Not on file  Physical Activity: Not on file  Stress: Not on file  Social Connections: Not on file  Intimate Partner Violence: Not on file      PHYSICAL EXAM  Vitals:   06/16/21 1022  BP: 102/63  Pulse: 68  Weight: 164 lb 8 oz (74.6 kg)    Body mass index is 23.6 kg/m.  Generalized: Well developed, in no acute distress .  Wheelchair bound,  hemiplegia,  Spasticity over left  side, leg and arm, hand is curled.  CN, facial hemi-spasticity, abnormal limitatio / Rotation of the neck, eye movement is limited, right abducens paresis.         DIAGNOSTIC DATA (LABS, IMAGING, TESTING) - I reviewed patient records, labs, notes, testing and imaging myself where available.  Lab Results  Component Value Date   WBC 4.1 08/19/2020   HGB 15.3 08/19/2020   HCT 42.5 08/19/2020   MCV 89  08/19/2020   PLT 276 08/19/2020      Component Value Date/Time   NA 140 08/19/2020 1019   K 4.3 08/19/2020 1019   CL 101 08/19/2020 1019   CO2 23 08/19/2020 1019   GLUCOSE 77 08/19/2020 1019   GLUCOSE 99 07/14/2020 0648   BUN 7 08/19/2020 1019   CREATININE 0.88 08/19/2020 1019   CALCIUM 9.7 08/19/2020 1019   PROT 6.5 08/19/2020 1019   ALBUMIN 4.4 08/19/2020 1019   AST 21 08/19/2020 1019   ALT 14 08/19/2020 1019   ALKPHOS 139 (H) 08/19/2020 1019   BILITOT 1.0 08/19/2020 1019   GFRNONAA 109 08/19/2020 1019   GFRNONAA >60 07/14/2020 0648   GFRAA 126 08/19/2020 1019      ASSESSMENT AND PLAN 40 y.o. 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 here with:  1.  Intractable seizures requiring VNS therapy in addition to medication.   The patient will continue on current medication regimen which includes Vimpat 200 mg twice a day, Lamictal 300 mg twice a day and Keppra 1500 mg twice a day. He stays on oral anticonvulsants as current.  CD   Mom did  bring his seizure journal, he had 2 seizure in 14 days!  Each time  At 8 PM.      VNS : We reset 06-16-2021. So our therapy report from today showed a output status of 1.875 mA lead impedance 3965 ohms, normal stimulation 183.5/day auto stimulation 172.8/day magnetic stimulation 0.3 times total 356.6 events.  These events since the last office visit which was 16 March 2021.  Therapy on and off time had to have been changed today be enabled a shorter threshold lower threshold for the auto stimulation at 20% heartbeat detection will be on and able to tachycardia detection, the magnet mode will be increased the output current will be increased from 4.875 mA to 1 mA frequency and pulse width stay the same on and off times day the same duty cycle states the same.  Auto  stimulation was increased from 1 mA to 1.125 mA with the same pulse width and on time.  Magnetic mode remained at 1.125 mA there same pulse was same on time, again the detection was reduced to an auto stimulation threshold of 20% this was a reset of 4 or more variables and the patient has an option of 2 more adjustments within the next 6 months. See report.      VNS: AspireSR [id number]   Generator Serial No. [id number]  10/14/2020 settings Normal-0.681mA AutoStim-0.66mA Magnet Mode-0.875 mA   11/19/2020 Increased the settings Normal Mode- 0.76mA Auto Stim 0.820mA Magnet Mode- 45m A  03-16-2021, We adjusted today's output from 8.75 2.875 mA auto stimulation output to 1 mA Magnet output to 1.125 mA frequency is 20 Hz pulse width has stayed at 250 s for all sleep components on time 30 seconds for auto stimulation and 60 seconds for magnet the above time is 5 minutes the duty cycle is 10%. Auto stimulation dropped form 40% to 30 %.  This were 4 changes , 95977.    I spent 20 minutes of face-to-face and non-face-to-face time with patient.  This included previsit chart review, lab review, study review, order entry, electronic health record documentation, patient education.  RV in 3-4 month to go to 1.75 mA Magnet and Device.    Larey Seat, MD   06/16/2021, 10:38 AM [facility] [address], [place], [place] [address] [phone]

## 2021-06-16 NOTE — Progress Notes (Signed)
Report after changes were made

## 2021-07-23 DIAGNOSIS — G808 Other cerebral palsy: Secondary | ICD-10-CM | POA: Diagnosis not present

## 2021-07-26 ENCOUNTER — Telehealth: Payer: Self-pay | Admitting: Neurology

## 2021-07-26 NOTE — Telephone Encounter (Addendum)
Insurance for prescriptions is [employer] Member ID- [id number] [place] [address] [place] [id number] Phone number for pharmacy help desk for providers: [phone]

## 2021-07-26 NOTE — Telephone Encounter (Signed)
Pt's mother, Rajvir Ernster (on [place]) called, received a letter from [employer]. They will no longer cover his lacosamide (VIMPAT) 200 MG TABS tablet. Letter instructed [place] to contact physician for an exception. Would like a call from the nurse.

## 2021-07-26 NOTE — Telephone Encounter (Signed)
Called the mom back and was able to discuss the concerns with her in regards to the medication. It doesn't appear that in past we have completed the PA before for this medication but sounds like this upcoming year pt will need it. Pt is not able to be on generic medications as previously has tried and has caused problems.

## 2021-07-27 NOTE — Telephone Encounter (Signed)
Called to advise the mom that insurance would not allow [place] to complete a authorization at this time because it is still a covered medication.  Advised that we would have to wait until the new year to complete an authorization.  Advised that I would set a reminder for 3 January to initiate the prior authorization at that time.  Advised her to also contact the office as a reminder to [place] to complete and she states that she will.

## 2021-09-07 ENCOUNTER — Telehealth: Payer: Self-pay | Admitting: Neurology

## 2021-09-07 NOTE — Telephone Encounter (Signed)
I have re submitted the PA for the pt on [employer] for Vimpat brand name.  KEY: [id number] Will wait for determination from insurance.

## 2021-09-07 NOTE — Telephone Encounter (Signed)
Pt's mother Harry Carr called as a reminder for lacosamide (VIMPAT) 200 MG TABS tablet authorization from previous note on November 22  "Called to advise the mom that insurance would not allow [place] to complete a authorization at this time because it is still a covered medication.  Advised that we would have to wait until the new year to complete an authorization.  Advised that I would set a reminder for 3 January to initiate the prior authorization at that time.  Advised her to also contact the office as a reminder to [place] to complete and she states that she will"

## 2021-09-08 NOTE — Telephone Encounter (Signed)
PA approved for the patient from 09/05/2021-09/07/2022 through [employer]

## 2021-09-09 ENCOUNTER — Encounter: Payer: Self-pay | Admitting: Neurology

## 2021-09-09 ENCOUNTER — Ambulatory Visit (INDEPENDENT_AMBULATORY_CARE_PROVIDER_SITE_OTHER): Payer: Medicare Other | Admitting: Neurology

## 2021-09-09 VITALS — BP 122/74 | HR 68 | Ht 70.0 in | Wt 164.0 lb

## 2021-09-09 DIAGNOSIS — G249 Dystonia, unspecified: Secondary | ICD-10-CM | POA: Diagnosis not present

## 2021-09-09 DIAGNOSIS — G40209 Localization-related (focal) (partial) symptomatic epilepsy and epileptic syndromes with complex partial seizures, not intractable, without status epilepticus: Secondary | ICD-10-CM | POA: Diagnosis not present

## 2021-09-09 DIAGNOSIS — Z4542 Encounter for adjustment and management of neuropacemaker (brain) (peripheral nerve) (spinal cord): Secondary | ICD-10-CM

## 2021-09-09 DIAGNOSIS — G40011 Localization-related (focal) (partial) idiopathic epilepsy and epileptic syndromes with seizures of localized onset, intractable, with status epilepticus: Secondary | ICD-10-CM

## 2021-09-09 DIAGNOSIS — Z9689 Presence of other specified functional implants: Secondary | ICD-10-CM

## 2021-09-09 MED ORDER — LAMOTRIGINE ER 300 MG PO TB24: 600.0000 mg | ORAL_TABLET | ORAL | 3 refills | Status: DC

## 2021-09-09 MED ORDER — KEPPRA XR 750 MG PO TB24: 1500.0000 mg | ORAL_TABLET | Freq: Two times a day (BID) | ORAL | 5 refills | Status: DC

## 2021-09-09 NOTE — Progress Notes (Signed)
Established Patient Office Visit  Subjective:  Patient ID: Harry Carr, male    DOB: 1981-06-03  Age: 41 y.o. MRN: [id number]  CC:  Chief Complaint  Patient presents with   Follow-up    Rm 10, w mother. Here to f/u for post VNS changes.     HPI Harry Carr presents for VNS interrogation and reprogramming of 4-5 settings.    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 [facility])     Past Surgical History:  Procedure Laterality Date   dorsal rhizotomy  age 29 yrs   VAGUS NERVE STIMULATOR INSERTION Left 07/14/2020   Procedure: VAGAL NERVE STIMULATOR IMPLANT;  Surgeon: Consuella Lose, MD;  Location: [place];  Service: Neurosurgery;  Laterality: Left;  anterior   WISDOM TOOTH EXTRACTION  03/2010    Family History  Problem Relation Age of Onset   Heart attack Father    Heart disease Maternal Grandmother    Cancer - Other Maternal Grandfather        lukemia    Social History   Socioeconomic History   Marital status: Single    Spouse name: Not on file   Number of children: 0   Years of education: Not on file   Highest education level: Not on file  Occupational History   Not on file  Tobacco Use   Smoking status: Never   Smokeless tobacco: Never  Substance and Sexual Activity   Alcohol use: Never   Drug use: No   Sexual activity: Not Currently  Other Topics Concern   Not on file  Social History Narrative   Right handed. Caffeine none.  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   Social Determinants of Health   Financial Resource Strain: Not on file  Food Insecurity: Not on file  Transportation Needs: Not on file  Physical Activity: Not on file  Stress: Not on file  Social Connections: Not on file  Intimate Partner Violence: Not on file     Outpatient Medications Prior to Visit  Medication Sig Dispense Refill   aspirin 81 MG tablet Take 81 mg by mouth daily.     diazePAM, 20 MG Dose, (VALTOCO 20 MG DOSE) 2 x 10 MG/0.1ML LQPK Place 1 spray into the nose as needed (each nostril 1 spray in status epilepticus). 3 each 3   lacosamide (VIMPAT) 200 MG TABS tablet Take 1 tablet (200 mg total) by mouth 2 (two) times daily. 180 tablet 1   losartan (COZAAR) 100 MG tablet Take 1 tablet (100 mg total) by mouth daily. 30 tablet 6   Multiple Vitamins-Minerals (MULTIVITAMIN WITH MINERALS) tablet Take 1 tablet by mouth daily.     prochlorperazine (COMPAZINE) 25 MG suppository Place 1 suppository (25 mg total) rectally every 12 (twelve) hours as needed for nausea or vomiting. 12 suppository 0   vitamin E 45 MG (100 UNITS) capsule Take 500 Units by mouth daily.     LamoTRIgine 300 MG TB24 24 hour tablet Take 2 tablets (600 mg total) by mouth every morning. 180 tablet 1   Levetiracetam (KEPPRA XR) 750 MG TB24 Take 2 tablets (1,500 mg total) by mouth in the morning and at bedtime. 120 tablet 5   No facility-administered medications prior  to visit.    ROS Review of Systems    Objective:    Physical Exam  BP 122/74    Pulse 68    Ht 5\' 10"  (1.778 m)    Wt 164 lb (74.4 kg)    BMI 23.53 kg/m  Wt Readings from Last 3 Encounters:  09/09/21 164 lb (74.4 kg)  06/16/21 164 lb 8 oz (74.6 kg)  01/27/21 165 lb (74.8 kg)     Health Maintenance Due  Topic Date Due   Pneumococcal Vaccine 33-31 Years old (1 - PCV) Never done   Hepatitis C Screening  Never done   TETANUS/TDAP  Never done   INFLUENZA VACCINE  04/05/2021    There are no preventive care reminders to display for this patient.  No results found for: TSH Lab Results  Component Value Date   WBC 4.1 08/19/2020   HGB 15.3 08/19/2020   HCT 42.5 08/19/2020   MCV 89 08/19/2020   PLT 276 08/19/2020   Lab Results  Component Value Date   NA 140 08/19/2020   K 4.3 08/19/2020    CO2 23 08/19/2020   GLUCOSE 77 08/19/2020   BUN 7 08/19/2020   CREATININE 0.88 08/19/2020   BILITOT 1.0 08/19/2020   ALKPHOS 139 (H) 08/19/2020   AST 21 08/19/2020   ALT 14 08/19/2020   PROT 6.5 08/19/2020   ALBUMIN 4.4 08/19/2020   CALCIUM 9.7 08/19/2020   ANIONGAP 9 07/14/2020   No results found for: CHOL No results found for: HDL No results found for: LDLCALC No results found for: TRIG No results found for: CHOLHDL No results found for: HGBA1C    Assessment & Plan:   Problem List Items Addressed This Visit     Dystonia   Status post placement of VNS (vagus nerve stimulation) device   Encounter for fitting and adjustment of vagus nerve stimulator - Primary   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    Dispense:  120 tablet    Refill:  5    Patients name is misspelled   LamoTRIgine 300 MG TB24 24 hour tablet    Sig: Take 2 tablets (600 mg total) by mouth every morning.    Dispense:  180 tablet    Refill:  3    Follow-up: Return in about 6 months (around 03/09/2022).    Larey Seat, MD

## 2021-09-09 NOTE — Progress Notes (Signed)
PATIENT: Harry Carr DOB: 1981-04-04  REASON FOR VISIT: follow up HISTORY FROM: patient and his mother.   HISTORY OF PRESENT ILLNESS: )09-09-2021: Today's meeting with Harry Carr and his mom revealed that the patient has had a significant decrease in seizure frequency from up to 6 a week down to about 1 1 to 1-1/2/week or 4 to 67-month.  So this decrease is quite significant his seizures that still broke through usually happened at a certain time at night or in the evening hours, and his remaining seizures have actually happened a little earlier around 6:30 PM instead of 8:52 PM.   09-09-2021: we interrogate the device again, Reviewing therapeutic settings we will increase the output current for magnet for the device and the auto stimulation threshold.  The off time will be reduced from 5 minutes to from 5 minutes to 3 minutes , If he will go from a 10% background duty cycle to now 16%.  Output current for the device is 1.125 million.  The output current for the auto stimulator will now be 1.25 mA the output current for the magnet will be increased to 1.3 0.75 mA.  Pulse width remains at 250 s. Magnet swipe was performed and the patient felt the increased current. Will continue to use magnet twice a day for 14 days.   He is remaining on triple anticonvulsive therapy, all BRAND NAME only. VIMPAT, LAMICTAL and Keppra.  10-10-2022_ Last RV with VNS, data sheets by RN Harry Carr.  In his last visit.  The patient's output current was increased to 1 mA air patient auto stimulation output current was increased to 1.125 mA Magnet mode output current was increased from 1.125 mA to 1.25 mA.  Detection auto stimulation threshold decreased to 20%.  Report after changes were made:    Rv 03-16-2021,  Harry Carr's seizure activity a has just increased again, the timing of medication has not influenced the most common seizure onset time. We adjusted today's output from 8.75 2.875 mA auto stimulation output  to 1 mA Magnet output to 1.125 mA frequency is 20 Hz pulse width has stayed at 250 s for all sleep components on time 30 seconds for auto stimulation and 60 seconds for magnet the above time is 5 minutes the duty cycle is 10%. Auto stimulation dropped form 40% to 30 %.  This were 4 changes , [id number]. Cover he remembers one onset of a seizure in a restaurant to at that time he could feel that the VNS stimulator was activating.  The last system interrogation was on 19 November 2020.  The last magnetic event 15 times timestamp June 19 June 20 is 23rd-25th 28th July 1 and July 3.    12-14-2020: VNS. Harry Carr has recently had only 2 seizures in 14 days, his medication changes were not yet implemented as his slow release medication is used up fb before changing to  ER/ XR form.    Hopefully, we can see those 8 PM seizures completely disappear with a change in medication release form.  Incoming setting somewhere and output current of 0.750 milliimpaired a signal frequency of 20 Hz a pulse width of 250 s, signal on time of 30 seconds and signal of time of 5 minutes.    Magnet is set to an output current of 1000 million.  Pulse was 250 s signal on time 60 seconds or 1 minute.  Outer stimulation current is 0.875 mA with a pulse width of of 250 s and  a signal on time of 30 seconds.  Tachycardia detection is 1 and hypersensitivity on level 4 threshold for outer stimulation is a heart rate change of 40%.       Last visit 11-16-2020, the patient's mother mentioned that he was for the first time seizure free for 6 days -  His seizures still occur at 8.30 PM.  Question of medication wearing off. He takes po meds at 9.30 Am and 9 Pm.  Aside from the changes to his VNS stimulator as outlined below the patient was also stimulated here in our presence and did not have any negative side effects or discomfort. Magnet swipe was performed in office. The patient has a slight cough, a clearing of the throat in response  to activation.  Voice was not longer hoarse.     I changed those of his seizure medications that are available and extended release 24-hour form to that form and I try to keep with the brand-name only policy.  So this patient will now use Lamictal 300 mg extended release form 24-hour tablets 2 in the morning 600 mg total daily.  Keppra has converted from 3 500 mg tablets to two 24-hour extended release tablets of 750 mg 2 in the morning 2 in the evening but can also be taken once a day.  Vimpat cannot be changed there is no extended release form available at this time.  My goal is to make sure that the medication does not wear off and causes seizures shortly before the second dose intake of the day.  I expect to have to find an medical exemption that [name] has in the past have breakthrough seizures with generic medications and there is clearly a need for brand-name only medication.   10/14/2020 settings Normal-0.658mA AutoStim-0.7mA Magnet Mode-0.875 mA   11/19/2020 Increased the settings Normal Mode- 0.83mA Auto Stim 0.828mA Magnet Mode- 67mA     10-14-2020: Harry Legacy, RN, is  assisting. Miss Harry Carr on the phone.  Interval History - still having 8-9 seizures a month, mostly at 6-8 Pm, subtherapeutic on current settings. Not lasting as long and no longer having headaches. Not coughing . Menu: output current up from 0.37 mA - increased new 0.5 mA- final 0.625 mA Magnet 0.635 - increased to 0.75 mA. And increased finally to 0.875 mA AUTO stimulation 0.375- to 0.5 today.- now 0.75  Pulse width 250 micro sec.  On time 30 sec.   Signal on 30 sec. Signal off 5 min.  Swipe magnet-  Step 1 was tolerated , step 2 further up, doing well .magnet swiped- patient reported no discomfort.   We rest 3 or more function in 2 steps each. He stays on oral anticonvulsants as current.  CD       09-08-2020' INTERVAL  HISTORY CD: Harry Carr is a 41 year male, accompanied by his mother, seen in  clinic following VNS placement by Dr. Kathyrn Sheriff on Jul 14 2020,  and then activated by Dr. Krista Blue at [facility]. . We are changing on these last settings , no changes today. Will exchange SR for XR form of medication in same dose.   Mr. Danielsen is a 41 year old male with a history of intractable seizures and wheelchair bound- .  He returns today for VNS adjustment.  The patient states that since the VNS his episodes have decreased to 2-3 episodes a week. The triggers for this patient are flickering lights, sometimes whole driving and when the son is low, sometimes on TV screen.  They continued to occur between 630 and 9:30 PM but less frequent-  Tolerates the current VNS settings well.   He encountered occassional hoarseness and swallowing difficulties, his mother swipes the magnet   Several times a day. She also noted that he has not had a severe headache after every event.   He remains on Vimpat, Keppra and Lamictal.   Current settings -his VNS is in as tired SR and 106 with the serial #[id number]. The device was activated by Dr Krista Blue on 11.23.2021 in my absence. The output setting is 0.25 mA frequency 20 Hz pulse width 250 s on time 30 seconds off time 5 minutes duty cycle 10%. Auto stimulation setting for an output of 0.375 mA pulse was 250 s on time 30 seconds magnet is set to an output of 0.5 mA same pulse width and on time here 60 seconds.  Generator battery is close to 100%.  We adjusted the outputs setting today to 0.375 mA this will also change the outer stimulation setting to 0.5 mA and the magnet 206.25 mA. Impedance and on is 4346 ohms, the generator battery close to 100%, the  output current: 0.375 mA  Also set are detection features of 40 tachycardia detection is enabled the threshold for the detection to a response is at 40% increase of heart rate.  Magnet swipe was performed in office. The patient has a slight cough, a clearing of the throat in response to activation.  Voice was hoarse.        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  He is also a current patient of Dr. Doy Mince at [facility] where he receives Botox injections to overcome the hemispasticity.  Seen here today 3 weeks after hospitalization at [facility] for status epilepticus, medications were increased in response to that activity 550 mg of Vimpat twice a day to 200 mg of Vimpat twice a day.  He could not be transferred to [facility] as there were no beds, but and observation.  In the emergency room took place.  Keppra was given 2 g by intravenous infusion and his dose of Lamictal was just continued but not altered.  He received at home nasal spray diazepam which did not shorten the seizure activity in the ED and the IV was established and 1 mg of Ativan was administered a second dose of 1 mg was given with 5 L oxygen by nasal cannula.  The patient began vomiting about 90 minutes after these 2 doses were given complained of a headache and appeared very drowsy he continued to get fluids is also given Toradol and Zofran.  Harry Carr is now 41 years old he has a history of cerebral palsy and seizure disorder and presented to the ED for an evaluation of a new breakthrough seizure 20 minutes prior to arrival by private car the patient had began seizing at home and this exceeded in duration his usual brief seizures of 10 to 15 seconds duration which she has at least weekly.  At [facility] a CBC with differential was done and the white blood cell count was 13.7 elevated with an absolute lymphocyte count of 6.9K per liter and an absolute monocyte count of 1.2 mL this could have been a viral infection.  However the patient tested negative for Covid and a urine sample which was delivered the next day  did not give any evidence of a urinary tract infection.  Liver function and  kidney function were normal a protocol of the medications given in the emergency room was printed and reviewed.  However he has since been taking the higher dose of Vimpat without being sedated or in any way obviously changed.  His primary care physician is Dr. Lillard Anes, MD.  neither the patient nor his mother have been covid vaccinated, claiming their 72 neurologist was hesitant to recommned vaccine in light of Corey's tendency to have  break through seizures. I explained,that a vaccine without a living attenuated virus, such as an mRNA c vaccine , would be unlikely to cause interference.   My goal would be to use a nasal spray with midazolam, rather than Diazepam.    I reviewed seizure documentation from his mother, he is having seizure about every other days, mostly around 7-9pm, despite polypharmacy, taking vimpat 200mg  bid, Lamictal (brand) 100mg  3 tabs bid, keppra 500mg  3 tabs bid, mother describes his seizures as staring spells, looking towards the left side, pausing for few minutes, no generalized clonic seizure recently   He had a history of status epilepticus in August 2000, requiring h hospitalization   He is wheelchair-bound, nonambulatory, enjoys watching ball games,   He received a left vagal nerve stimulator by neurosurgeon Dr. Kathyrn Sheriff on July 14, 2020, incision healing well,  REVIEW OF SYSTEMS: Out of a complete 14 system review of symptoms, the patient complains only of the following symptoms, and all other reviewed systems are negative.  Better seizure control.  Has dysphagia and dysphonia.     ALLERGIES: No Known Allergies  HOME MEDICATIONS: Outpatient Medications Prior to Visit  Medication Sig Dispense Refill   aspirin 81 MG tablet Take 81 mg by mouth daily.     diazePAM, 20 MG Dose, (VALTOCO 20 MG DOSE) 2 x 10 MG/0.1ML LQPK Place 1 spray into the nose as needed (each nostril 1 spray in status epilepticus). 3 each 3   lacosamide (VIMPAT) 200 MG  TABS tablet Take 1 tablet (200 mg total) by mouth 2 (two) times daily. 180 tablet 1   LamoTRIgine 300 MG TB24 24 hour tablet Take 2 tablets (600 mg total) by mouth every morning. 180 tablet 1   Levetiracetam (KEPPRA XR) 750 MG TB24 Take 2 tablets (1,500 mg total) by mouth in the morning and at bedtime. 120 tablet 5   losartan (COZAAR) 100 MG tablet Take 1 tablet (100 mg total) by mouth daily. 30 tablet 6   Multiple Vitamins-Minerals (MULTIVITAMIN WITH MINERALS) tablet Take 1 tablet by mouth daily.     prochlorperazine (COMPAZINE) 25 MG suppository Place 1 suppository (25 mg total) rectally every 12 (twelve) hours as needed for nausea or vomiting. 12 suppository 0   vitamin E 45 MG (100 UNITS) capsule Take 500 Units by mouth daily.     No facility-administered medications prior to visit.    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 ([place])     PAST SURGICAL HISTORY: Past Surgical History:  Procedure Laterality Date   dorsal rhizotomy  age 69 yrs   VAGUS NERVE STIMULATOR INSERTION Left 07/14/2020   Procedure: VAGAL NERVE STIMULATOR IMPLANT;  Surgeon: Kathyrn Sheriff,  Nena Polio, MD;  Location: [place];  Service: Neurosurgery;  Laterality: Left;  anterior   WISDOM TOOTH EXTRACTION  03/2010    FAMILY HISTORY: Family History  Problem Relation Age of Onset   Heart attack Father    Heart disease Maternal Grandmother    Cancer - Other Maternal Grandfather        lukemia    SOCIAL HISTORY: Social History   Socioeconomic History   Marital status: Single    Spouse name: Not on file   Number of children: 0   Years of education: Not on file   Highest education level: Not on file  Occupational History   Not on file  Tobacco Use   Smoking status: Never   Smokeless  tobacco: Never  Substance and Sexual Activity   Alcohol use: Never   Drug use: No   Sexual activity: Not Currently  Other Topics Concern   Not on file  Social History Narrative   Right handed. Caffeine none.  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   Social Determinants of Health   Financial Resource Strain: Not on file  Food Insecurity: Not on file  Transportation Needs: Not on file  Physical Activity: Not on file  Stress: Not on file  Social Connections: Not on file  Intimate Partner Violence: Not on file      PHYSICAL EXAM  Vitals:   09/09/21 1316  BP: 122/74  Pulse: 68  Weight: 164 lb (74.4 kg)  Height: 5\' 10"  (1.778 m)    Body mass index is 23.53 kg/m.  Generalized: Well developed, in no acute distress .  Wheelchair bound,  hemiplegia,  Spasticity over left  side, leg and arm, hand is curled.  CN, facial hemi-spasticity, abnormal limitatio / Rotation of the neck, eye movement is limited, right abducens paresis.         DIAGNOSTIC DATA (LABS, IMAGING, TESTING) - I reviewed patient records, labs, notes, testing and imaging myself where available.  Lab Results  Component Value Date   WBC 4.1 08/19/2020   HGB 15.3 08/19/2020   HCT 42.5 08/19/2020   MCV 89 08/19/2020   PLT 276 08/19/2020      Component Value Date/Time   NA 140 08/19/2020 1019   K 4.3 08/19/2020 1019   CL 101 08/19/2020 1019   CO2 23 08/19/2020 1019   GLUCOSE 77 08/19/2020 1019   GLUCOSE 99 07/14/2020 0648   BUN 7 08/19/2020 1019   CREATININE 0.88 08/19/2020 1019   CALCIUM 9.7 08/19/2020 1019   PROT 6.5 08/19/2020 1019   ALBUMIN 4.4 08/19/2020 1019   AST 21 08/19/2020 1019   ALT 14 08/19/2020 1019   ALKPHOS 139 (H) 08/19/2020 1019   BILITOT 1.0 08/19/2020 1019   GFRNONAA 109 08/19/2020 1019   GFRNONAA >60 07/14/2020 0648   GFRAA 126 08/19/2020 1019      ASSESSMENT AND PLAN 41 y.o. 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 here with:  1.  Intractable seizures requiring VNS therapy in addition to medication.   The patient will continue on current medication regimen which includes Vimpat 200 mg twice a day, Lamictal 300 mg twice a day and Keppra 1500 mg twice a day. He stays on oral anticonvulsants as current.  CD   Mom did  bring his seizure journal, he had 2 seizure  in 14 days!  Each time  At 8 PM.    VNS : We reset none today 12-14-2020,    He will follow-up in 4 months for further VNS adjustments with Dr Brett Fairy, MD, as needed. .  VNS: AspireSR [id number]   Generator Serial No. [id number]  10/14/2020 settings Normal-0.676mA AutoStim-0.60mA Magnet Mode-0.875 mA   11/19/2020 Increased the settings Normal Mode- 0.54mA Auto Stim 0.872mA Magnet Mode- 2m A  03-16-2021, We adjusted today's output from 8.75 2.875 mA auto stimulation output to 1 mA Magnet output to 1.125 mA frequency is 20 Hz pulse width has stayed at 250 s for all sleep components on time 30 seconds for auto stimulation and 60 seconds for magnet the above time is 5 minutes the duty cycle is 10%. Auto stimulation dropped form 40% to 30 %.  PLAN : This were 4 changes to the patient's CN X implanted VNS stimulator , [id number].  He is remaining on triple anticonvulsive therapy, all BRAND NAME only. VIMPAT, LAMICTAL and Keppra.  10-10-2022_ Last RV with VNS, data sheets by RN Harry Carr.  output current was increased to 1 mA air patient auto stimulation output current was increased to 1.125 mA Magnet mode output current was increased from 1.125 mA to 1.25 mA.  Detection auto stimulation threshold decreased to 20%.    I spent 20 minutes of face-to-face and non-face-to-face time with patient and his mother, the VNS technician was present as well. This included previsit chart review, lab review, study review, order  entry, electronic health record documentation, patient education.  RV in 6 months.  Larey Seat, MD  09/09/2021, 1:49 PM [facility] [address] [place], [place] [address] [phone]

## 2021-09-09 NOTE — Patient Instructions (Signed)
09-09-2021: we interrogate the device again, Reviewing therapeutic settings we will increase the output current for magnet for the device and the auto stimulation threshold.  The off time will be reduced from 5 minutes to from 5 minutes to 3 minutes , If he will go from a 10% background duty cycle to now 16%.  Output current for the device is 1.125 million.  The output current for the auto stimulator will now be 1.25 mA the output current for the magnet will be increased to 1.3 0.75 mA.  Pulse width remains at 250 s. Magnet swipe was performed and the patient felt the increased current. Will continue to use magnet twice a day for 14 days.    He is remaining on triple anticonvulsive therapy, all BRAND NAME only. VIMPAT, LAMICTAL and Keppra.

## 2021-09-13 ENCOUNTER — Other Ambulatory Visit: Payer: Self-pay | Admitting: Neurology

## 2021-09-13 ENCOUNTER — Telehealth: Payer: Self-pay | Admitting: Neurology

## 2021-09-13 MED ORDER — OMEPRAZOLE 20 MG PO CPDR: 20.0000 mg | DELAYED_RELEASE_CAPSULE | Freq: Every evening | ORAL | 1 refills | Status: DC

## 2021-09-13 NOTE — Telephone Encounter (Signed)
Called the patient's pharmacy and they were able to process and run the patient's brand name Lamictal. The mom has also stated the [facility] needed a PA. They stated that the [facility] went through no problems and they already picked it up. Called the mom and made her aware that the Lamictal was filled with no problems. She asked if omeprazole could be called into the pharmacy for the patient because she has noticed at night time when he lays down he feels like something is coming up. She gave OTC prilosec and that helped tremendously. Advised a script will be sent to the pharmacy.

## 2021-09-13 NOTE — Telephone Encounter (Signed)
Received the PA request today. Will complete for the patient and provide update once determination completed.

## 2021-09-13 NOTE — Telephone Encounter (Signed)
Attempted PA on [facility] [id number]  Received a response,"The patient currently has access to the requested medication and a Prior Authorization is not needed for the patient/medication."

## 2021-09-13 NOTE — Addendum Note (Signed)
Addended by: Darleen Crocker on: 09/13/2021 11:38 AM   Modules accepted: Orders

## 2021-09-13 NOTE — Telephone Encounter (Signed)
Pt's mom called states she is unable to get her sons seizure medication due to them needing a PA. Pt's mother has some questions and is requesting a call back.

## 2021-09-28 ENCOUNTER — Other Ambulatory Visit: Payer: Self-pay | Admitting: Legal Medicine

## 2021-09-28 DIAGNOSIS — I10 Essential (primary) hypertension: Secondary | ICD-10-CM

## 2021-10-29 DIAGNOSIS — G808 Other cerebral palsy: Secondary | ICD-10-CM | POA: Diagnosis not present

## 2021-11-29 ENCOUNTER — Other Ambulatory Visit: Payer: Self-pay | Admitting: *Deleted

## 2021-11-29 MED ORDER — VIMPAT 200 MG PO TABS: 200.0000 mg | ORAL_TABLET | Freq: Two times a day (BID) | ORAL | 0 refills | Status: DC

## 2021-11-29 NOTE — Telephone Encounter (Signed)
Last visit: 09/09/21 ?Next visit: 02/17/22 ?Checked [place] registry, last filled on 11/09/2021 #60/30 ? ?Rx request sent to Dr Brett Fairy. Rx will be expired when pt is due again for refill on 12/10/21.  ?

## 2021-12-15 ENCOUNTER — Telehealth: Payer: Self-pay | Admitting: Neurology

## 2021-12-15 NOTE — Telephone Encounter (Addendum)
Pt is requesting a refill for VIMPAT 200 MG TABS tablet. ? ?Pharmacy: [facility]  ?Pt's mother asked this message be sent because pt has 2 days worth remaining and is being told by the pharmacy they have not received a refill request on this medication.  ? ?

## 2021-12-15 NOTE — Telephone Encounter (Signed)
Checked drug registry. Pt last refilled 11/09/21 #60. Last seen 09/09/21 and next f/u 02/17/22. ? ?Per pt chart, refill was sent in on 11/29/21 #180 to [facility]. I called pharmacy and spoke w/ Lanelle Bal. She placed me on hold. She came back on the line and stated they just talked to mother and advised her they do have prescription. She will go to pharmacy to pick up, nothing further needed. ?

## 2022-02-10 ENCOUNTER — Ambulatory Visit: Payer: Medicare Other

## 2022-02-10 ENCOUNTER — Telehealth: Payer: Self-pay | Admitting: Neurology

## 2022-02-10 ENCOUNTER — Ambulatory Visit: Payer: Medicare Other | Admitting: Neurology

## 2022-02-10 DIAGNOSIS — R918 Other nonspecific abnormal finding of lung field: Secondary | ICD-10-CM | POA: Diagnosis not present

## 2022-02-10 DIAGNOSIS — G809 Cerebral palsy, unspecified: Secondary | ICD-10-CM | POA: Diagnosis not present

## 2022-02-10 DIAGNOSIS — R569 Unspecified convulsions: Secondary | ICD-10-CM | POA: Diagnosis not present

## 2022-02-10 DIAGNOSIS — Z79899 Other long term (current) drug therapy: Secondary | ICD-10-CM | POA: Diagnosis not present

## 2022-02-10 NOTE — Telephone Encounter (Signed)
Discussed with outside hospital.

## 2022-02-14 DIAGNOSIS — R569 Unspecified convulsions: Secondary | ICD-10-CM | POA: Diagnosis not present

## 2022-02-17 ENCOUNTER — Ambulatory Visit (INDEPENDENT_AMBULATORY_CARE_PROVIDER_SITE_OTHER): Payer: Medicare Other | Admitting: Neurology

## 2022-02-17 ENCOUNTER — Encounter: Payer: Self-pay | Admitting: Neurology

## 2022-02-17 VITALS — BP 121/78 | HR 70 | Ht 70.0 in

## 2022-02-17 DIAGNOSIS — G40209 Localization-related (focal) (partial) symptomatic epilepsy and epileptic syndromes with complex partial seizures, not intractable, without status epilepticus: Secondary | ICD-10-CM | POA: Diagnosis not present

## 2022-02-17 DIAGNOSIS — G40309 Generalized idiopathic epilepsy and epileptic syndromes, not intractable, without status epilepticus: Secondary | ICD-10-CM

## 2022-02-17 DIAGNOSIS — Z4542 Encounter for adjustment and management of neuropacemaker (brain) (peripheral nerve) (spinal cord): Secondary | ICD-10-CM

## 2022-02-17 DIAGNOSIS — G808 Other cerebral palsy: Secondary | ICD-10-CM | POA: Diagnosis not present

## 2022-02-17 DIAGNOSIS — Z9689 Presence of other specified functional implants: Secondary | ICD-10-CM | POA: Diagnosis not present

## 2022-02-17 MED ORDER — NAYZILAM 5 MG/0.1ML NA SOLN: 5.0000 mg | NASAL | 5 refills | Status: DC | PRN

## 2022-02-17 MED ORDER — ALPRAZOLAM 0.25 MG PO TABS: ORAL_TABLET | ORAL | 0 refills | Status: DC

## 2022-02-17 NOTE — Progress Notes (Signed)
   Established Patient Office Visit  Subjective   Patient ID: Harry Carr, male    DOB: [birth year]  Age: 41 y.o. MRN: [id number]  Chief Complaint  Patient presents with   Follow-up    Pt with mom, rm 53. Here for VNS follow up. Increase frequency of seizures, up to 9/10 a month. Went to the ER  6/8.     HPI    ROS    Objective:     BP 121/78   Pulse 70   Ht '5\' 10"'$  (1.778 m)   BMI 23.53 kg/m    Physical Exam   No results found for any visits on 02/17/22.    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    Larey Seat, MD

## 2022-02-17 NOTE — Progress Notes (Signed)
Established Patient Office Visit  Subjective:  Patient ID: Harry Carr, male    DOB: 08/24/1981  Age: 41 y.o. MRN: [id number]  CC:  Chief Complaint  Patient presents with   Follow-up    Pt with mom, rm 71. Here for VNS follow up. Increase frequency of seizures, up to 9/10 a month. Went to the ER  6/8.     HPI  02-17-2022:  RV with Mom.  Harry Carr presents for VNS interrogation and reprogramming of 4-5 settings. He was seen for a prolonged break through seizure ,which lasted beyond emergency medications. Swiping of the VNS magnet. The ED in [facility] used the term aspiration Pneumonia, but X ray has not confirmed. Gave Diazepam. Wrote for ATB , but not needed.  All seizures at 8 Pm.  His witching hour.    Today we will refill Nayzilam, as a nasal spray and I discussed use of cannabinol.  He is treated for acid reflux.   VNS : We will change the settings_ up on 1.25 mA on VNS,  auto stim to 1.375 mA, Magnet to 1.5 mA. Magnet swipe increase to tid. Pulse width  250 microseconds. On time 30. Off time 3 minutes.   Xanax as a resume medication, 0.25 mg at 7 Pm to see if this prevents seizures.       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 [facility])     Past Surgical History:  Procedure Laterality Date   dorsal rhizotomy  age 27 yrs   VAGUS NERVE STIMULATOR INSERTION Left 07/14/2020   Procedure: VAGAL NERVE STIMULATOR IMPLANT;  Surgeon: Consuella Lose, MD;  Location: [facility];  Service: Neurosurgery;  Laterality: Left;  anterior   WISDOM TOOTH EXTRACTION  03/2010    Family History  Problem Relation Age of Onset   Heart attack Father    Heart disease Maternal Grandmother    Cancer - Other Maternal Grandfather        lukemia    Social History   Socioeconomic  History   Marital status: Single    Spouse name: Not on file   Number of children: 0   Years of education: Not on file   Highest education level: Not on file  Occupational History   Not on file  Tobacco Use   Smoking status: Never   Smokeless tobacco: Never  Substance and Sexual Activity   Alcohol use: Never   Drug use: No   Sexual activity: Not Currently  Other Topics Concern   Not on file  Social History Narrative   Right handed. Caffeine none.  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   Social Determinants of Health   Financial Resource Strain: Not on file  Food Insecurity: Not on file  Transportation Needs: Not on file  Physical Activity: Not on file  Stress: Not on file  Social Connections: Not on file  Intimate Partner Violence: Not on file    Outpatient Medications Prior to Visit  Medication Sig Dispense Refill   aspirin 81 MG tablet Take 81 mg by mouth daily.     diazePAM, 20 MG Dose, (VALTOCO 20 MG DOSE) 2 x 10 MG/0.1ML LQPK Place 1 spray into the nose as needed (each nostril  1 spray in status epilepticus). 3 each 3   KEPPRA XR 750 MG TB24 Take 2 tablets (1,500 mg total) by mouth in the morning and at bedtime. 120 tablet 5   lacosamide (VIMPAT) 200 MG TABS tablet Take 1 tablet (200 mg total) by mouth 2 (two) times daily. 180 tablet 1   LamoTRIgine 300 MG TB24 24 hour tablet Take 2 tablets (600 mg total) by mouth every morning. 180 tablet 3   losartan (COZAAR) 100 MG tablet TAKE ONE TABLET BY MOUTH EVERY DAY 30 tablet 6   Multiple Vitamins-Minerals (MULTIVITAMIN WITH MINERALS) tablet Take 1 tablet by mouth daily.     omeprazole (PRILOSEC) 20 MG capsule Take 1 capsule (20 mg total) by mouth every evening. 90 capsule 1   prochlorperazine (COMPAZINE) 25 MG suppository Place 1 suppository (25 mg total) rectally every 12 (twelve) hours as needed for nausea or vomiting. 12 suppository 0   VIMPAT 200 MG TABS tablet Take 1 tablet (200 mg total) by mouth 2 (two)  times daily. 180 tablet 0   vitamin E 45 MG (100 UNITS) capsule Take 500 Units by mouth daily.     No facility-administered medications prior to visit.    ROS Review of Systems    Objective:    Physical Exam  BP 121/78   Pulse 70   Ht '5\' 10"'$  (1.778 m)   BMI 23.53 kg/m  Wt Readings from Last 3 Encounters:  09/09/21 164 lb (74.4 kg)  06/16/21 164 lb 8 oz (74.6 kg)  01/27/21 165 lb (74.8 kg)     Health Maintenance Due  Topic Date Due   Hepatitis C Screening  Never done   TETANUS/TDAP  Never done    There are no preventive care reminders to display for this patient.  No results found for: "TSH" Lab Results  Component Value Date   WBC 4.1 08/19/2020   HGB 15.3 08/19/2020   HCT 42.5 08/19/2020   MCV 89 08/19/2020   PLT 276 08/19/2020   Lab Results  Component Value Date   NA 140 08/19/2020   K 4.3 08/19/2020   CO2 23 08/19/2020   GLUCOSE 77 08/19/2020   BUN 7 08/19/2020   CREATININE 0.88 08/19/2020   BILITOT 1.0 08/19/2020   ALKPHOS 139 (H) 08/19/2020   AST 21 08/19/2020   ALT 14 08/19/2020   PROT 6.5 08/19/2020   ALBUMIN 4.4 08/19/2020   CALCIUM 9.7 08/19/2020   ANIONGAP 9 07/14/2020   No results found for: "CHOL" No results found for: "HDL" No results found for: "LDLCALC" No results found for: "TRIG" No results found for: "CHOLHDL" No results found for: "HGBA1C"    Assessment & Plan:   Today we will refill Nayzilam, as a nasal spray and I discussed use of cannabinol.  He is treated for acid reflux.   VNS : We will change the settings_ up on 1.25 mA on VNS,  auto stim to 1.375 mA, Magnet to 1.5 mA. Magnet swipe increase to tid. Pulse width  250 microseconds. On time 30. Off time 3 minutes.   Xanax as a resume medication, 0.25 mg at 7 Pm to see if this prevents seizures.     Follow-up:  I 3-4 months.    Larey Seat, MD

## 2022-02-17 NOTE — Progress Notes (Signed)
Changes updated:

## 2022-02-17 NOTE — Progress Notes (Signed)
   Established Patient Office Visit  Subjective   Patient ID: Harry Carr, male    DOB: 06-21-1981  Age: 41 y.o. MRN: [id number]  Chief Complaint  Patient presents with   Follow-up    Pt with mom, rm 72. Here for VNS follow up. Increase frequency of seizures, up to 9/10 a month. Went to the ER  6/8.     HPI    ROS    Objective:     BP 121/78   Pulse 70   Ht '5\' 10"'$  (1.778 m)   BMI 23.53 kg/m    Physical Exam   No results found for any visits on 02/17/22.    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    Larey Seat, MD

## 2022-02-17 NOTE — Patient Instructions (Signed)
Nyzilam nasal spray ordered for seizure resume.  Xanax 0.25 mg po at 7 Pm for 21 days.

## 2022-02-18 ENCOUNTER — Telehealth: Payer: Self-pay | Admitting: Neurology

## 2022-02-18 NOTE — Telephone Encounter (Signed)
Pt's mother,Jane Rossell said insurance requesting PA for Midazolam (NAYZILAM) 5 MG/0.1ML SOLN. Can call back if have any questions

## 2022-02-21 ENCOUNTER — Other Ambulatory Visit: Payer: Self-pay | Admitting: Neurology

## 2022-02-21 MED ORDER — NAYZILAM 5 MG/0.1ML NA SOLN: 5.0000 mg | NASAL | 5 refills | Status: DC | PRN

## 2022-02-21 NOTE — Telephone Encounter (Signed)
PA submitted on CMM/[employer] for the pt KEY: [id number] Approved immediately for the pt. Having Dr Brett Fairy resend a new script to allow for more in a months time frame.

## 2022-02-21 NOTE — Telephone Encounter (Signed)
Noted. We will work on this asap for the pt

## 2022-02-28 NOTE — Telephone Encounter (Signed)
[employer] Dorene Grebe) Insurance need prescriber to call for the PA. Contact info: [phone]

## 2022-03-04 DIAGNOSIS — G808 Other cerebral palsy: Secondary | ICD-10-CM | POA: Diagnosis not present

## 2022-03-09 ENCOUNTER — Other Ambulatory Visit: Payer: Self-pay

## 2022-03-09 MED ORDER — VIMPAT 200 MG PO TABS: 200.0000 mg | ORAL_TABLET | Freq: Two times a day (BID) | ORAL | 1 refills | Status: DC

## 2022-03-09 MED ORDER — OMEPRAZOLE 20 MG PO CPDR
20.0000 mg | DELAYED_RELEASE_CAPSULE | Freq: Every evening | ORAL | 3 refills | Status: DC
Start: 2022-03-09 — End: 2023-02-27

## 2022-03-09 NOTE — Progress Notes (Signed)
Last OV was on 02/17/22.  Next OV is scheduled for 06/20/22.  Last RX was written on 02/16/22 for 60 tabs.   [place] Drug Database has been reviewed.

## 2022-03-22 ENCOUNTER — Ambulatory Visit: Payer: Medicare Other

## 2022-03-24 ENCOUNTER — Ambulatory Visit (INDEPENDENT_AMBULATORY_CARE_PROVIDER_SITE_OTHER): Payer: Medicare Other

## 2022-03-24 ENCOUNTER — Other Ambulatory Visit: Payer: Self-pay

## 2022-03-24 VITALS — BP 118/78 | HR 72 | Resp 16 | Ht 70.0 in | Wt 164.0 lb

## 2022-03-24 DIAGNOSIS — Z0000 Encounter for general adult medical examination without abnormal findings: Secondary | ICD-10-CM | POA: Diagnosis not present

## 2022-03-25 NOTE — Progress Notes (Signed)
Subjective:   Harry Carr is a 41 y.o. male who presents for Medicare Annual/Subsequent preventive examination.  This wellness visit is conducted by a nurse.  The patient's medications were reviewed and reconciled since the patient's last visit.  History details were provided by the patient and his mother Harry Carr.  The history appears to be reliable.    Medical History: Patient history and Family history was reviewed  Medications, Allergies, and preventative health maintenance was reviewed and updated.  Loys has had recent medication changes due to an increase in seizure activity.  They have not noticed an improvement.    Cardiac Risk Factors include: male gender;sedentary lifestyle     Objective:    Today's Vitals   03/24/22 1104 03/24/22 1117  BP:  118/78  Pulse:  72  Resp:  16  SpO2:  96%  Weight:  164 lb (74.4 kg)  Height:  '5\' 10"'$  (1.778 m)  PainSc: 0-No pain 0-No pain   Body mass index is 23.53 kg/m.     06/08/2020    4:52 PM  Advanced Directives  Does Patient Have a Medical Advance Directive? No  Would patient like information on creating a medical advance directive? No - Guardian declined    Current Medications (verified) Outpatient Encounter Medications as of 03/24/2022  Medication Sig   ALPRAZolam (XANAX) 0.25 MG tablet Take at 7 pm 0.25 mg xanax for a period of 21 days- keep seizure  journal   aspirin 81 MG tablet Take 81 mg by mouth daily.   KEPPRA XR 750 MG TB24 Take 2 tablets (1,500 mg total) by mouth in the morning and at bedtime.   lacosamide (VIMPAT) 200 MG TABS tablet Take 1 tablet (200 mg total) by mouth 2 (two) times daily.   LamoTRIgine 300 MG TB24 24 hour tablet Take 2 tablets (600 mg total) by mouth every morning.   losartan (COZAAR) 100 MG tablet TAKE ONE TABLET BY MOUTH EVERY DAY   Midazolam (NAYZILAM) 5 MG/0.1ML SOLN Place 5 mg into the nose as needed (Administer nasal spray at seizure onset. may use an additional nasal spray if the first is  ineffective after 10 minutes. Do not exceed more than 2 sprays per seizure episode).   Multiple Vitamins-Minerals (MULTIVITAMIN WITH MINERALS) tablet Take 1 tablet by mouth daily.   omeprazole (PRILOSEC) 20 MG capsule Take 1 capsule (20 mg total) by mouth every evening.   VIMPAT 200 MG TABS tablet Take 1 tablet (200 mg total) by mouth 2 (two) times daily.   vitamin E 45 MG (100 UNITS) capsule Take 500 Units by mouth daily.   [DISCONTINUED] prochlorperazine (COMPAZINE) 25 MG suppository Place 1 suppository (25 mg total) rectally every 12 (twelve) hours as needed for nausea or vomiting. (Patient not taking: Reported on 03/25/2022)   No facility-administered encounter medications on file as of 03/24/2022.    Allergies (verified) Patient has no known allergies.   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 [facility])    Past Surgical History:  Procedure Laterality Date   dorsal rhizotomy  age 2 yrs   VAGUS NERVE STIMULATOR INSERTION Left 07/14/2020   Procedure: VAGAL NERVE STIMULATOR IMPLANT;  Surgeon: Consuella Lose, MD;  Location: [place];  Service: Neurosurgery;  Laterality: Left;  anterior   WISDOM TOOTH EXTRACTION  03/2010   Family History  Problem Relation Age of Onset   Heart attack Father    Heart disease Maternal Grandmother    Cancer - Other Maternal Grandfather        lukemia   Social History   Socioeconomic History   Marital status: Single   Number of children: 0  Tobacco Use   Smoking status: Never   Smokeless tobacco: Never  Substance and Sexual Activity   Alcohol use: Never   Drug use: No   Sexual activity: Not Currently  Social History Narrative   Right handed. Caffeine none.  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   Social  Determinants of Health   Financial Resource Strain: Low Risk  (03/25/2022)   Overall Financial Resource Strain (CARDIA)    Difficulty of Paying Living Expenses: Not hard at all  Food Insecurity: No Food Insecurity (03/25/2022)   Hunger Vital Sign    Worried About Running Out of Food in the Last Year: Never true    Ran Out of Food in the Last Year: Never true  Transportation Needs: No Transportation Needs (03/25/2022)   PRAPARE - Hydrologist (Medical): No    Lack of Transportation (Non-Medical): No  Physical Activity: Not on file  Stress: No Stress Concern Present (03/25/2022)   [place]    Feeling of Stress : Not at all  Social Connections: Moderately Isolated (03/25/2022)   Social Connection and Isolation Panel [NHANES]    Frequency of Communication with Friends and Family: More than three times a week    Frequency of Social Gatherings with Friends and Family: More than three times a week    Attends Religious Services: More than 4 times per year    Active Member of [employer] or Organizations: No    Attends Archivist Meetings: Never    Marital Status: Never married    Tobacco Counseling Counseling given: Never smoker   Clinical Intake:  Pre-visit preparation completed: Yes Pain : No/denies pain Pain Score: 0-No pain   BMI - recorded: 23.53 Nutritional Status: BMI of 19-24  Normal Nutritional Risks: None Diabetes: No How often do you need to have someone help you when you read instructions, pamphlets, or other written materials from your doctor or pharmacy?: 4 - Often Interpreter Needed?: No    Activities of Daily Living    03/25/2022    9:49 AM  In your present state of health, do you have any difficulty performing the following activities:  Hearing? 0  Vision? 0  Difficulty concentrating or making decisions? 0  Walking or climbing stairs? 1  Dressing or bathing? 1   Doing errands, shopping? 1  Preparing Food and eating ? Y  Using the Toilet? N  In the past six months, have you accidently leaked urine? Y  Do you have problems with loss of bowel control? N  Managing your Medications? N  Managing your Finances? N  Housekeeping or managing your Housekeeping? Y    Patient Care Team: Lillard Anes, MD as PCP - General (Family Medicine) Dohmeier, Asencion Partridge, MD as Consulting Physician (Neurology)     Assessment:   This is a routine wellness examination for [facility].  Hearing/Vision screen No results found.  Dietary issues and exercise activities discussed: Current Exercise Habits: The patient does not participate in regular exercise at present  Depression Screen  03/25/2022    9:06 AM 08/19/2020    9:48 AM 07/01/2014    3:22 PM  PHQ 2/9 Scores  PHQ - 2 Score 0 0 0    Fall Risk    03/25/2022    9:48 AM 05/10/2017   11:24 AM 07/01/2014    3:22 PM  Fall Risk   Falls in the past year? 0 No No  Number falls in past yr: 0    Injury with Fall? 0    Risk for fall due to : Impaired balance/gait;Other (Comment)  History of fall(s)  Risk for fall due to: Comment seizures    Follow up Falls evaluation completed;Falls prevention discussed      FALL RISK PREVENTION PERTAINING TO THE HOME:  Any stairs in or around the home? No  If so, are there any without handrails? No  Home free of loose throw rugs in walkways, pet beds, electrical cords, etc? Yes  Adequate lighting in your home to reduce risk of falls? Yes   ASSISTIVE DEVICES UTILIZED TO PREVENT FALLS:  Life alert? No  Use of a cane, walker or w/c? Yes  Grab bars in the bathroom? Yes  Shower chair or bench in shower? Yes       Immunizations Immunization History  Administered Date(s) Administered   Influenza-Unspecified 09/05/2018    TDAP status: Due, Education has been provided regarding the importance of this vaccine. Advised may receive this vaccine at local pharmacy or  Health Dept. Aware to provide a copy of the vaccination record if obtained from local pharmacy or Health Dept. Verbalized acceptance and understanding.  Flu Vaccine status: Up to date  Pneumococcal vaccine status: Up to date  Covid-19 vaccine status: Declined, Education has been provided regarding the importance of this vaccine but patient still declined. Advised may receive this vaccine at local pharmacy or Health Dept.or vaccine clinic. Aware to provide a copy of the vaccination record if obtained from local pharmacy or Health Dept. Verbalized acceptance and understanding.   Screening Tests Health Maintenance  Topic Date Due   Hepatitis C Screening  Never done   TETANUS/TDAP  Never done   INFLUENZA VACCINE  04/05/2022   HIV Screening  Completed   HPV VACCINES  Aged Out    Health Maintenance  Health Maintenance Due  Topic Date Due   Hepatitis C Screening  Never done   TETANUS/TDAP  Never done    Lung Cancer Screening: (Low Dose CT Chest recommended if Age 50-80 years, 30 pack-year currently smoking OR have quit w/in 15years.) does not qualify.    Additional Screening:  Vision Screening: Recommended annual ophthalmology exams for early detection of glaucoma and other disorders of the eye. Is the patient up to date with their annual eye exam?  Yes   Dental Screening: Recommended annual dental exams for proper oral hygiene     Plan:     I have personally reviewed and noted the following in the patient's chart:   Medical and social history Use of alcohol, tobacco or illicit drugs  Current medications and supplements including opioid prescriptions. Patient is not currently taking opioid prescriptions. Functional ability and status Nutritional status Physical activity Advanced directives List of other physicians Hospitalizations, surgeries, and ER visits in previous 12 months Vitals Screenings to include cognitive, depression, and falls Referrals and  appointments  In addition, I have reviewed and discussed with patient certain preventive protocols, quality metrics, and best practice recommendations. A written personalized care plan for preventive services as well  as general preventive health recommendations were provided to patient.     Erie Noe, LPN   5/80/0634

## 2022-03-25 NOTE — Patient Instructions (Signed)
Harry Carr , Thank you for taking time to come for your Medicare Wellness Visit. I appreciate your ongoing commitment to your health goals. Please review the following plan we discussed and let me know if I can assist you in the future.   Screening recommendations/referrals: Recommended yearly ophthalmology/optometry visit for glaucoma screening and checkup Recommended yearly dental visit for hygiene and checkup  Vaccinations: Influenza vaccine: Due in the [facility], Male Preventive care refers to lifestyle choices and visits with your health care provider that can promote health and wellness. What does preventive care include?  A yearly physical exam. This is also called an annual well check. Dental exams once or twice a year. Routine eye exams. Ask your health care provider how often you should have your eyes checked. Personal lifestyle choices, including: Daily care of your teeth and gums. Regular physical activity. Eating a healthy diet. Avoiding tobacco and drug use. Limiting alcohol use. Practicing safe sex. Taking low-dose aspirin every day starting at age 35. What happens during an annual well check? The services and screenings done by your health care provider during your annual well check will depend on your age, overall health, lifestyle risk factors, and family history of disease. Counseling  Your health care provider may ask you questions about your: Alcohol use. Tobacco use. Drug use. Emotional well-being. Home and relationship well-being. Sexual activity. Eating habits. Work and work Statistician. Screening  You may have the following tests or measurements: Height, weight, and BMI. Blood pressure. Lipid and cholesterol levels. These may be checked every 5 years, or more frequently if you are over 15 years old. Skin check. Lung cancer screening. You may have this screening every year starting at age 87 if you have a 30-pack-year history of  smoking and currently smoke or have quit within the past 15 years. Fecal occult blood test (FOBT) of the stool. You may have this test every year starting at age 54. Flexible sigmoidoscopy or colonoscopy. You may have a sigmoidoscopy every 5 years or a colonoscopy every 10 years starting at age 18. Prostate cancer screening. Recommendations will vary depending on your family history and other risks. Hepatitis C blood test. Hepatitis B blood test. Sexually transmitted disease (STD) testing. Diabetes screening. This is done by checking your blood sugar (glucose) after you have not eaten for a while (fasting). You may have this done every 1-3 years. Discuss your test results, treatment options, and if necessary, the need for more tests with your health care provider. Vaccines  Your health care provider may recommend certain vaccines, such as: Influenza vaccine. This is recommended every year. Tetanus, diphtheria, and acellular pertussis (Tdap, Td) vaccine. You may need a Td booster every 10 years. Zoster vaccine. You may need this after age 75. Pneumococcal 13-valent conjugate (PCV13) vaccine. You may need this if you have certain conditions and have not been vaccinated. Pneumococcal polysaccharide (PPSV23) vaccine. You may need one or two doses if you smoke cigarettes or if you have certain conditions. Talk to your health care provider about which screenings and vaccines you need and how often you need them. This information is not intended to replace advice given to you by your health care provider. Make sure you discuss any questions you have with your health care provider. Document Released: 09/18/2015 Document Revised: 05/11/2016 Document Reviewed: 06/23/2015 Elsevier Interactive Patient Education  2017 [place] Prevention in the Home Falls can cause injuries. They can happen to people of all ages.  There are many things you can do to make your home safe and to help prevent  falls. What can I do on the outside of my home? Regularly fix the edges of walkways and driveways and fix any cracks. Remove anything that might make you trip as you walk through a door, such as a raised step or threshold. Trim any bushes or trees on the path to your home. Use bright outdoor lighting. Clear any walking paths of anything that might make someone trip, such as rocks or tools. Regularly check to see if handrails are loose or broken. Make sure that both sides of any steps have handrails. Any raised decks and porches should have guardrails on the edges. Have any leaves, snow, or ice cleared regularly. Use sand or salt on walking paths during winter. Clean up any spills in your garage right away. This includes oil or grease spills. What can I do in the bathroom? Use night lights. Install grab bars by the toilet and in the tub and shower. Do not use towel bars as grab bars. Use non-skid mats or decals in the tub or shower. If you need to sit down in the shower, use a plastic, non-slip stool. Keep the floor dry. Clean up any water that spills on the floor as soon as it happens. Remove soap buildup in the tub or shower regularly. Attach bath mats securely with double-sided non-slip rug tape. Do not have throw rugs and other things on the floor that can make you trip. What can I do in the bedroom? Use night lights. Make sure that you have a light by your bed that is easy to reach. Do not use any sheets or blankets that are too big for your bed. They should not hang down onto the floor. Have a firm chair that has side arms. You can use this for support while you get dressed. Do not have throw rugs and other things on the floor that can make you trip. What can I do in the kitchen? Clean up any spills right away. Avoid walking on wet floors. Keep items that you use a lot in easy-to-reach places. If you need to reach something above you, use a strong step stool that has a grab  bar. Keep electrical cords out of the way. Do not use floor polish or wax that makes floors slippery. If you must use wax, use non-skid floor wax. Do not have throw rugs and other things on the floor that can make you trip. What can I do with my stairs? Do not leave any items on the stairs. Make sure that there are handrails on both sides of the stairs and use them. Fix handrails that are broken or loose. Make sure that handrails are as long as the stairways. Check any carpeting to make sure that it is firmly attached to the stairs. Fix any carpet that is loose or worn. Avoid having throw rugs at the top or bottom of the stairs. If you do have throw rugs, attach them to the floor with carpet tape. Make sure that you have a light switch at the top of the stairs and the bottom of the stairs. If you do not have them, ask someone to add them for you. What else can I do to help prevent falls? Wear shoes that: Do not have high heels. Have rubber bottoms. Are comfortable and fit you well. Are closed at the toe. Do not wear sandals. If you use a stepladder: Make  sure that it is fully opened. Do not climb a closed stepladder. Make sure that both sides of the stepladder are locked into place. Ask someone to hold it for you, if possible. Clearly mark and make sure that you can see: Any grab bars or handrails. First and last steps. Where the edge of each step is. Use tools that help you move around (mobility aids) if they are needed. These include: Canes. Walkers. Scooters. Crutches. Turn on the lights when you go into a dark area. Replace any light bulbs as soon as they burn out. Set up your furniture so you have a clear path. Avoid moving your furniture around. If any of your floors are uneven, fix them. If there are any pets around you, be aware of where they are. Review your medicines with your doctor. Some medicines can make you feel dizzy. This can increase your chance of falling. Ask  your doctor what other things that you can do to help prevent falls. This information is not intended to replace advice given to you by your health care provider. Make sure you discuss any questions you have with your health care provider. Document Released: 06/18/2009 Document Revised: 01/28/2016 Document Reviewed: 09/26/2014 Elsevier Interactive Patient Education  2017 [employer].

## 2022-04-04 ENCOUNTER — Telehealth: Payer: Self-pay | Admitting: Neurology

## 2022-04-04 ENCOUNTER — Other Ambulatory Visit: Payer: Self-pay | Admitting: *Deleted

## 2022-04-04 NOTE — Telephone Encounter (Signed)
Reviewed pt chart. Last seen 02/17/22 and next f/u 06/20/22.  Checked drug registry. Last refilled 02/17/22 #30.  Will send refill request to MD to e-scribe.

## 2022-04-04 NOTE — Telephone Encounter (Signed)
Pt mother [name]) is calling and requesting a refill on ALPRAZolam (XANAX) 0.25 MG tablet. [name] also wanted to let provider know that  ALPRAZolam Duanne Moron) 0.25 MG tablet Is working for Pt sp far. [name] is requesting prescription be sent to [facility].

## 2022-04-05 ENCOUNTER — Other Ambulatory Visit: Payer: Self-pay

## 2022-04-05 MED ORDER — KEPPRA XR 750 MG PO TB24: 1500.0000 mg | ORAL_TABLET | Freq: Two times a day (BID) | ORAL | 5 refills | Status: DC

## 2022-04-06 MED ORDER — ALPRAZOLAM 0.25 MG PO TABS
ORAL_TABLET | ORAL | 2 refills | Status: DC
Start: 2022-04-06 — End: 2022-07-04

## 2022-04-07 ENCOUNTER — Telehealth: Payer: Self-pay | Admitting: Neurology

## 2022-04-07 NOTE — Telephone Encounter (Signed)
error 

## 2022-05-06 ENCOUNTER — Other Ambulatory Visit: Payer: Self-pay | Admitting: Legal Medicine

## 2022-05-06 DIAGNOSIS — I10 Essential (primary) hypertension: Secondary | ICD-10-CM

## 2022-06-04 ENCOUNTER — Other Ambulatory Visit: Payer: Self-pay | Admitting: Legal Medicine

## 2022-06-04 DIAGNOSIS — I10 Essential (primary) hypertension: Secondary | ICD-10-CM

## 2022-06-10 ENCOUNTER — Other Ambulatory Visit: Payer: Self-pay | Admitting: Legal Medicine

## 2022-06-10 DIAGNOSIS — I10 Essential (primary) hypertension: Secondary | ICD-10-CM

## 2022-06-16 ENCOUNTER — Other Ambulatory Visit: Payer: Self-pay

## 2022-06-16 DIAGNOSIS — I10 Essential (primary) hypertension: Secondary | ICD-10-CM

## 2022-06-16 MED ORDER — LOSARTAN POTASSIUM 100 MG PO TABS: 100.0000 mg | ORAL_TABLET | Freq: Every day | ORAL | 0 refills | Status: DC

## 2022-06-16 NOTE — Progress Notes (Signed)
Chief Complaint  Patient presents with   Follow-up    Pt with mom, rm 1. Presents today for VNS follow up. He has had a increase in sz activity. He had them 7 days in a row leading up to sat 14th. Mom swiped the magnet during the event. She avg using the nasal spray once a month. She states in June when starting the xanax 0.25 mg as a "preventative which helped for the first month. He went 9 days without seizures    HISTORY OF PRESENT ILLNESS:  06/20/22 ALL:  Harry Carr is a 41 y.o. male here today for follow up for seizures. He returns with his mother who aids in history. He continues Vimpat (Brand) '200mg'$  BID, Keppra (Brand) '1500mg'$  BID and lamotrigine '300mg'$  TB24 QD. He is tolerating meds. Mom added alprazolam 0.'25mg'$  at 7pm. She reports he went 9 days without a seizure after starting. She thought that seizures seemed a little less frequent over the first month but seizures have increased in frequency over the past three months. He seems to be having at least one event every day, sometimes two. She is swiping magnet at least once daily and then prior t, during and following every seizure event. She reports he will complain of a headache then she notes head turns to one side and eyes are deviated. Event usually last a couple minutes. She uses Nayzilam if event last longer. She averages 1-2 doses per month. Headache resolves spontaneously following event. No obvious post ictal state. He is followed closely by PCP. He has fasting labs scheduled for tomorrow. He seems to tolerate VNS. No significant sore throat or worsening cough. He continues omeprazole as needed for GERD. He is taking MVI daily. He is a picky eater.  HISTORY (copied from Dr Dohmeier's previous note)  Harry Carr presents for VNS interrogation and reprogramming of 4-5 settings. He was seen for a prolonged break through seizure ,which lasted beyond emergency medications. Swiping of the VNS magnet. The ED in [facility] used the term  aspiration Pneumonia, but X ray has not confirmed. Gave Diazepam. Wrote for ATB , but not needed.  All seizures at 8 Pm.  His witching hour.    Today we will refill Nayzilam, as a nasal spray and I discussed use of cannabinol.  He is treated for acid reflux.   VNS : We will change the settings_ up on 1.25 mA on VNS,  auto stim to 1.375 mA, Magnet to 1.5 mA. Magnet swipe increase to tid. Pulse width  250 microseconds. On time 30. Off time 3 minutes.    Xanax as a resume medication, 0.25 mg at 7 Pm to see if this prevents seizures   REVIEW OF SYSTEMS: Out of a complete 14 system review of symptoms, the patient complains only of the following symptoms, seizures, headaches, and all other reviewed systems are negative.   ALLERGIES: No Known Allergies   HOME MEDICATIONS: Outpatient Medications Prior to Visit  Medication Sig Dispense Refill   ALPRAZolam (XANAX) 0.25 MG tablet Take at 7 pm 0.25 mg xanax for a period of 21 days- keep seizure  journal 30 tablet 2   aspirin 81 MG tablet Take 81 mg by mouth daily.     KEPPRA XR 750 MG 24 hr tablet Take 2 tablets (1,500 mg total) by mouth in the morning and at bedtime. 120 tablet 5   LamoTRIgine 300 MG TB24 24 hour tablet Take 2 tablets (600 mg total) by mouth every  morning. 180 tablet 3   losartan (COZAAR) 100 MG tablet Take 1 tablet (100 mg total) by mouth daily. for blood pressure 30 tablet 0   Midazolam (NAYZILAM) 5 MG/0.1ML SOLN Place 5 mg into the nose as needed (Administer nasal spray at seizure onset. may use an additional nasal spray if the first is ineffective after 10 minutes. Do not exceed more than 2 sprays per seizure episode). 5 each 5   Multiple Vitamins-Minerals (MULTIVITAMIN WITH MINERALS) tablet Take 1 tablet by mouth daily.     omeprazole (PRILOSEC) 20 MG capsule Take 1 capsule (20 mg total) by mouth every evening. 90 capsule 3   VIMPAT 200 MG TABS tablet Take 1 tablet (200 mg total) by mouth 2 (two) times daily. 180 tablet 1    vitamin E 45 MG (100 UNITS) capsule Take 500 Units by mouth daily.     lacosamide (VIMPAT) 200 MG TABS tablet Take 1 tablet (200 mg total) by mouth 2 (two) times daily. 180 tablet 1   No facility-administered medications prior to visit.     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[facility])      PAST SURGICAL HISTORY: Past Surgical History:  Procedure Laterality Date   dorsal rhizotomy  age 55 yrs   VAGUS NERVE STIMULATOR INSERTION Left 07/14/2020   Procedure: VAGAL NERVE STIMULATOR IMPLANT;  Surgeon: Harry Lose, MD;  Location: [place];  Service: Neurosurgery;  Laterality: Left;  anterior   WISDOM TOOTH EXTRACTION  03/2010     FAMILY HISTORY: Family History  Problem Relation Age of Onset   Heart attack Father    Heart disease Maternal Grandmother    Cancer - Other Maternal Grandfather        lukemia     SOCIAL HISTORY: Social History   Socioeconomic History   Marital status: Single    Spouse name: Not on file   Number of children: 0   Years of education: Not on file   Highest education level: Not on file  Occupational History   Not on file  Tobacco Use   Smoking status: Never   Smokeless tobacco: Never  Substance and Sexual Activity   Alcohol use: Never   Drug use: No   Sexual activity: Not Currently  Other Topics Concern   Not on file  Social History Narrative   Right handed. Caffeine none.  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   Social Determinants of Health   Financial Resource Strain: Low Risk  (03/25/2022)   Overall Financial Resource Strain (CARDIA)    Difficulty of Paying Living Expenses: Not hard at all  Food Insecurity: No Food Insecurity (03/25/2022)   Hunger Vital Sign    Worried About Running Out  of Food in the Last Year: Never true    Ran Out of Food in the Last Year: Never true  Transportation Needs: No Transportation Needs (03/25/2022)   PRAPARE - Hydrologist (Medical): No    Lack of Transportation (Non-Medical): No  Physical Activity: Not on file  Stress: No Stress Concern Present (03/25/2022)   [place]    Feeling of Stress : Not at all  Social Connections: Moderately Isolated (03/25/2022)   Social Connection and Isolation Panel [NHANES]    Frequency of Communication with Friends and Family: More than three times a week    Frequency of Social Gatherings with Friends and Family: More than three times a week    Attends Religious Services: More than 4 times per year    Active Member of [employer] or Organizations: No    Attends Archivist Meetings: Never    Marital Status: Never married  Intimate Partner Violence: Not At Risk (03/25/2022)   Humiliation, Afraid, Rape, and Kick questionnaire    Fear of Current or Ex-Partner: No    Emotionally Abused: No    Physically Abused: No    Sexually Abused: No     PHYSICAL EXAM  Vitals:   06/20/22 1407  BP: 130/75  Pulse: 68  Height: '5\' 10"'$  (1.778 m)   Body mass index is 23.53 kg/m.  Generalized: Well developed, in no acute distress  Cardiology: normal rate and rhythm, no murmur auscultated  Respiratory: clear to auscultation bilaterally    Neurological examination  Mentation: Alert, developmentally delayed. Follows all commands, mild dysarthria Cranial nerve II-XII: Pupils were equal round reactive to light. Extraocular movements were limited, abnormal rotation of neck. Facial sensation and strength were normal. Head turning and shoulder shrug  were normal and symmetric. Motor: The motor testing reveals 5 over 5 strength of all 4 extremities. 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  Gait and station: Gait into assessed, in  wheelchair    DIAGNOSTIC DATA (LABS, IMAGING, TESTING) - I reviewed patient records, labs, notes, testing and imaging myself where available.  Lab Results  Component Value Date   WBC 4.1 08/19/2020   HGB 15.3 08/19/2020   HCT 42.5 08/19/2020   MCV 89 08/19/2020   PLT 276 08/19/2020      Component Value Date/Time   NA 140 08/19/2020 1019   K 4.3 08/19/2020 1019   CL 101 08/19/2020 1019   CO2 23 08/19/2020 1019   GLUCOSE 77 08/19/2020 1019   GLUCOSE 99 07/14/2020 0648   BUN 7 08/19/2020 1019   CREATININE 0.88 08/19/2020 1019   CALCIUM 9.7 08/19/2020 1019   PROT 6.5 08/19/2020 1019   ALBUMIN 4.4 08/19/2020 1019   AST 21 08/19/2020 1019   ALT 14 08/19/2020 1019   ALKPHOS 139 (H) 08/19/2020 1019   BILITOT 1.0 08/19/2020 1019   GFRNONAA 109 08/19/2020 1019   GFRNONAA >60 07/14/2020 0648   GFRAA 126 08/19/2020 1019   No results found for: "CHOL", "HDL", "LDLCALC", "LDLDIRECT", "TRIG", "CHOLHDL" No results found for: "HGBA1C" No results found for: "VITAMINB12" No results found for: "TSH"      No data to display               No data to display           ASSESSMENT AND PLAN  41 y.o. 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 here with    Generalized convulsive epilepsy ([place]) - Plan: CBC with Differential/Platelets, CMP, Lacosamide, Lamotrigine level, Levetiracetam level, CANCELED: Levetiracetam level, CANCELED: Lamotrigine level, CANCELED: Lacosamide, CANCELED: CBC with Differential/Platelets, CANCELED: CMP  Harry Carr is tolerating AEDs and VNS. He has had continued breakthrough activity despite prophylactic use of alprazolam. We have adjusted VNS settings as documented. Mom was encouraged to swipe magnet  three times daily as well as with any aura, during and after seizure. We  have swiped twice in office to ensure tolerability. He will continue Vimpat, Keppra and lamotrigine as prescribed. Labs ordered, today. Mom request to have labs drawn at PCP office tomorrow with CPE labs. He may continue omeprazole as needed. Vitamin D and calcium recommendations discussed. Healthy lifestyle habits encouraged. He will follow up with PCP as directed. He will return to see Dr Brett Fairy in 4 months, sooner if needed. He and his mother verbalize understanding and agreement with this plan.    Orders Placed This Encounter  Procedures   CBC with Differential/Platelets    Standing Status:   Future    Standing Expiration Date:   06/21/2023   CMP    Standing Status:   Future    Standing Expiration Date:   06/21/2023   Lacosamide    Standing Status:   Future    Standing Expiration Date:   06/21/2023   Lamotrigine level    Standing Status:   Future    Standing Expiration Date:   06/21/2023   Levetiracetam level    Standing Status:   Future    Standing Expiration Date:   06/21/2023     No orders of the defined types were placed in this encounter.    Debbora Presto, MSN, FNP-C 06/20/2022, 3:14 PM  [facility] [address], [place], [place] [address] [phone]

## 2022-06-17 DIAGNOSIS — G808 Other cerebral palsy: Secondary | ICD-10-CM | POA: Diagnosis not present

## 2022-06-20 ENCOUNTER — Encounter: Payer: Self-pay | Admitting: Family Medicine

## 2022-06-20 ENCOUNTER — Ambulatory Visit (INDEPENDENT_AMBULATORY_CARE_PROVIDER_SITE_OTHER): Payer: Medicare Other | Admitting: Family Medicine

## 2022-06-20 DIAGNOSIS — G40011 Localization-related (focal) (partial) idiopathic epilepsy and epileptic syndromes with seizures of localized onset, intractable, with status epilepticus: Secondary | ICD-10-CM

## 2022-06-20 DIAGNOSIS — G808 Other cerebral palsy: Secondary | ICD-10-CM | POA: Diagnosis not present

## 2022-06-20 DIAGNOSIS — I10 Essential (primary) hypertension: Secondary | ICD-10-CM | POA: Insufficient documentation

## 2022-06-20 DIAGNOSIS — G40309 Generalized idiopathic epilepsy and epileptic syndromes, not intractable, without status epilepticus: Secondary | ICD-10-CM | POA: Diagnosis not present

## 2022-06-20 NOTE — Progress Notes (Signed)
Subjective:  Patient ID: Harry Carr, male    DOB: [birth year]  Age: 41 y.o. MRN: [id number]  No chief complaint on file.   HPI Patient presents for follow up of hypertension.  Patient tolerating Losartan 100 mg daily, Aspirin 81 mg daily well without side effects. Patient is working on maintaining diet and exercise regimen and follows up as directed.   GERD: Taking Omeprazole 20 mg daily.  Epilepsy: Patient on Keppra 750 mg 2 tablets twice a day, Vimpat 200 mg twice a day, Lamotrigine 300 mg daily, Alprazolam 0.25 mg daily Current Outpatient Medications on File Prior to Visit  Medication Sig Dispense Refill   ALPRAZolam (XANAX) 0.25 MG tablet Take at 7 pm 0.25 mg xanax for a period of 21 days- keep seizure  journal 30 tablet 2   aspirin 81 MG tablet Take 81 mg by mouth daily.     KEPPRA XR 750 MG 24 hr tablet Take 2 tablets (1,500 mg total) by mouth in the morning and at bedtime. 120 tablet 5   LamoTRIgine 300 MG TB24 24 hour tablet Take 2 tablets (600 mg total) by mouth every morning. 180 tablet 3   losartan (COZAAR) 100 MG tablet Take 1 tablet (100 mg total) by mouth daily. for blood pressure 30 tablet 0   Midazolam (NAYZILAM) 5 MG/0.1ML SOLN Place 5 mg into the nose as needed (Administer nasal spray at seizure onset. may use an additional nasal spray if the first is ineffective after 10 minutes. Do not exceed more than 2 sprays per seizure episode). 5 each 5   Multiple Vitamins-Minerals (MULTIVITAMIN WITH MINERALS) tablet Take 1 tablet by mouth daily.     omeprazole (PRILOSEC) 20 MG capsule Take 1 capsule (20 mg total) by mouth every evening. 90 capsule 3   VIMPAT 200 MG TABS tablet Take 1 tablet (200 mg total) by mouth 2 (two) times daily. 180 tablet 1   vitamin E 45 MG (100 UNITS) capsule Take 500 Units by mouth daily.     No current facility-administered medications on file prior to visit.   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 [facility])    Past Surgical History:  Procedure Laterality Date   dorsal rhizotomy  age 51 yrs   VAGUS NERVE STIMULATOR INSERTION Left 07/14/2020   Procedure: VAGAL NERVE STIMULATOR IMPLANT;  Surgeon: Consuella Lose, MD;  Location: [place];  Service: Neurosurgery;  Laterality: Left;  anterior   WISDOM TOOTH EXTRACTION  03/2010    Family History  Problem Relation Age of Onset   Heart attack Father    Heart disease Maternal Grandmother    Cancer - Other Maternal Grandfather        lukemia   Social History   Socioeconomic History   Marital status: Single    Spouse name: Not on file   Number of children: 0   Years of education: Not on file   Highest education level: Not on file  Occupational History   Not on file  Tobacco Use   Smoking status: Never   Smokeless tobacco: Never  Substance and Sexual Activity   Alcohol use: Never   Drug use: No   Sexual activity: Not Currently  Other Topics Concern   Not  on file  Social History Narrative   Right handed. Caffeine none.  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   Social Determinants of Health   Financial Resource Strain: Low Risk  (03/25/2022)   Overall Financial Resource Strain (CARDIA)    Difficulty of Paying Living Expenses: Not hard at all  Food Insecurity: No Food Insecurity (03/25/2022)   Hunger Vital Sign    Worried About Running Out of Food in the Last Year: Never true    Ran Out of Food in the Last Year: Never true  Transportation Needs: No Transportation Needs (03/25/2022)   PRAPARE - Hydrologist (Medical): No    Lack of Transportation (Non-Medical): No  Physical Activity: Not on file  Stress: No Stress Concern Present (03/25/2022)   [place]     Feeling of Stress : Not at all  Social Connections: Moderately Isolated (03/25/2022)   Social Connection and Isolation Panel [NHANES]    Frequency of Communication with Friends and Family: More than three times a week    Frequency of Social Gatherings with Friends and Family: More than three times a week    Attends Religious Services: More than 4 times per year    Active Member of [employer] or Organizations: No    Attends Archivist Meetings: Never    Marital Status: Never married    Review of Systems   Objective:  There were no vitals taken for this visit.     06/20/2022    2:07 PM 03/24/2022   11:17 AM 02/17/2022    2:25 PM  BP/Weight  Systolic BP 681 275 170  Diastolic BP 75 78 78  Wt. (Lbs)  164   BMI  23.53 kg/m2     Physical Exam  Diabetic Foot Exam - Simple   No data filed      Lab Results  Component Value Date   WBC 4.1 08/19/2020   HGB 15.3 08/19/2020   HCT 42.5 08/19/2020   PLT 276 08/19/2020   GLUCOSE 77 08/19/2020   ALT 14 08/19/2020   AST 21 08/19/2020   NA 140 08/19/2020   K 4.3 08/19/2020   CL 101 08/19/2020   CREATININE 0.88 08/19/2020   BUN 7 08/19/2020   CO2 23 08/19/2020   INR 1.0 06/08/2020      Assessment & Plan:   Problem List Items Addressed This Visit       Cardiovascular and Mediastinum   Essential hypertension, benign - Primary     Nervous and Auditory   Generalized convulsive epilepsy ([place])  .  No orders of the defined types were placed in this encounter.   No orders of the defined types were placed in this encounter.    Follow-up: No follow-ups on file.  An After Visit Summary was printed and given to the patient.  Reinaldo Meeker, MD [facility] [phone]

## 2022-06-20 NOTE — Patient Instructions (Signed)
Below is our plan:  We will continue Keppra '1500mg'$  and Vimpat '200mg'$  twice daily and lamotrigine '300mg'$  daily. Remember to swipe the magnet at least three times daily, every day. Also swipe with any aura, during and after seizure.   Please make sure you are consistent with timing of seizure medication. I recommend annual visit with primary care provider (PCP) for complete physical and routine blood work. I recommend daily intake of vitamin D (400-800iu) and calcium (800-'1000mg'$ ) for bone health. Discuss Dexa screening with PCP.   According to [place] law, you can not drive unless you are seizure / syncope free for at least 6 months and under physician's care.  Please maintain precautions. Do not participate in activities where a loss of awareness could harm you or someone else. No swimming alone, no tub bathing, no hot tubs, no driving, no operating motorized vehicles (cars, ATVs, motocycles, etc), lawnmowers, power tools or firearms. No standing at heights, such as rooftops, ladders or stairs. Avoid hot objects such as stoves, heaters, open fires. Wear a helmet when riding a bicycle, scooter, skateboard, etc. and avoid areas of traffic. Set your water heater to 120 degrees or less.   Please make sure you are staying well hydrated. I recommend 50-60 ounces daily. Well balanced diet and regular exercise encouraged. Consistent sleep schedule with 6-8 hours recommended.   Please continue follow up with care team as directed.   Follow up with Dr Brett Fairy in 4 months  You may receive a survey regarding today's visit. I encourage you to leave honest feed back as I do use this information to improve patient care. Thank you for seeing me today!

## 2022-06-21 ENCOUNTER — Ambulatory Visit (INDEPENDENT_AMBULATORY_CARE_PROVIDER_SITE_OTHER): Payer: Medicare Other | Admitting: Legal Medicine

## 2022-06-21 ENCOUNTER — Encounter: Payer: Self-pay | Admitting: Legal Medicine

## 2022-06-21 VITALS — BP 118/80 | HR 63 | Temp 97.3°F | Resp 16 | Ht 70.0 in | Wt 164.5 lb

## 2022-06-21 DIAGNOSIS — Z6823 Body mass index (BMI) 23.0-23.9, adult: Secondary | ICD-10-CM | POA: Diagnosis not present

## 2022-06-21 DIAGNOSIS — I10 Essential (primary) hypertension: Secondary | ICD-10-CM

## 2022-06-21 DIAGNOSIS — G40309 Generalized idiopathic epilepsy and epileptic syndromes, not intractable, without status epilepticus: Secondary | ICD-10-CM

## 2022-06-24 LAB — COMPREHENSIVE METABOLIC PANEL
ALT: 15 IU/L (ref 0–44)
AST: 21 IU/L (ref 0–40)
Albumin/Globulin Ratio: 1.9 (ref 1.2–2.2)
Albumin: 4.7 g/dL (ref 4.1–5.1)
Alkaline Phosphatase: 128 IU/L — ABNORMAL HIGH (ref 44–121)
BUN/Creatinine Ratio: 8 — ABNORMAL LOW (ref 9–20)
BUN: 7 mg/dL (ref 6–20)
Bilirubin Total: 0.8 mg/dL (ref 0.0–1.2)
CO2: 25 mmol/L (ref 20–29)
Calcium: 9.6 mg/dL (ref 8.7–10.2)
Chloride: 102 mmol/L (ref 96–106)
Creatinine, Ser: 0.87 mg/dL (ref 0.76–1.27)
Globulin, Total: 2.5 g/dL (ref 1.5–4.5)
Glucose: 86 mg/dL (ref 70–99)
Potassium: 4.2 mmol/L (ref 3.5–5.2)
Sodium: 141 mmol/L (ref 134–144)
Total Protein: 7.2 g/dL (ref 6.0–8.5)
eGFR: 113 mL/min/{1.73_m2} (ref >59)

## 2022-06-24 LAB — CBC WITH DIFFERENTIAL/PLATELET
Basophils Absolute: 0 10*3/uL (ref 0.0–0.2)
Basos: 1 %
EOS (ABSOLUTE): 0.1 10*3/uL (ref 0.0–0.4)
Eos: 2 %
Hematocrit: 44.8 % (ref 37.5–51.0)
Hemoglobin: 15.4 g/dL (ref 13.0–17.7)
Immature Grans (Abs): 0 10*3/uL (ref 0.0–0.1)
Immature Granulocytes: 0 %
Lymphocytes Absolute: 1.7 10*3/uL (ref 0.7–3.1)
Lymphs: 39 %
MCH: 31.6 pg (ref 26.6–33.0)
MCHC: 34.4 g/dL (ref 31.5–35.7)
MCV: 92 fL (ref 79–97)
Monocytes Absolute: 0.3 10*3/uL (ref 0.1–0.9)
Monocytes: 7 %
Neutrophils Absolute: 2.3 10*3/uL (ref 1.4–7.0)
Neutrophils: 51 %
Platelets: 251 10*3/uL (ref 150–450)
RBC: 4.88 x10E6/uL (ref 4.14–5.80)
RDW: 11.3 % — ABNORMAL LOW (ref 11.6–15.4)
WBC: 4.4 10*3/uL (ref 3.4–10.8)

## 2022-06-24 LAB — LAMOTRIGINE LEVEL: Lamotrigine Lvl: 11.3 ug/mL (ref 2.0–20.0)

## 2022-06-24 LAB — LIPID PANEL
Chol/HDL Ratio: 4.4 ratio (ref 0.0–5.0)
Cholesterol, Total: 181 mg/dL (ref 100–199)
HDL: 41 mg/dL (ref >39)
LDL Chol Calc (NIH): 108 mg/dL — ABNORMAL HIGH (ref 0–99)
Triglycerides: 182 mg/dL — ABNORMAL HIGH (ref 0–149)
VLDL Cholesterol Cal: 32 mg/dL (ref 5–40)

## 2022-06-24 LAB — LACOSAMIDE: Lacosamide: 10.1 ug/mL — ABNORMAL HIGH (ref 5.0–10.0)

## 2022-06-24 LAB — LEVETIRACETAM LEVEL: Levetiracetam Lvl: 50.9 ug/mL — ABNORMAL HIGH (ref 10.0–40.0)

## 2022-06-24 LAB — CARDIOVASCULAR RISK ASSESSMENT

## 2022-06-26 NOTE — Progress Notes (Signed)
LDL cholesterol high, lacosamide 10.1 slightly high, Levecetiradine 50.9 high, CBC normal, kidney and liver tests normal, lamotrigine 11.3 therapeuric lp

## 2022-06-27 ENCOUNTER — Telehealth: Payer: Self-pay | Admitting: *Deleted

## 2022-06-27 NOTE — Telephone Encounter (Signed)
Called and spoke with pt mother, [name]. Relayed results per AL,NP note. She verbalized understanding and appreciation.

## 2022-07-04 ENCOUNTER — Other Ambulatory Visit: Payer: Self-pay

## 2022-07-04 MED ORDER — ALPRAZOLAM 0.25 MG PO TABS: ORAL_TABLET | ORAL | 2 refills | Status: DC

## 2022-07-06 ENCOUNTER — Other Ambulatory Visit: Payer: Self-pay | Admitting: Legal Medicine

## 2022-07-06 DIAGNOSIS — I10 Essential (primary) hypertension: Secondary | ICD-10-CM

## 2022-09-07 ENCOUNTER — Other Ambulatory Visit: Payer: Self-pay | Admitting: *Deleted

## 2022-09-07 MED ORDER — VIMPAT 200 MG PO TABS: 200.0000 mg | ORAL_TABLET | Freq: Two times a day (BID) | ORAL | 1 refills | Status: DC

## 2022-09-08 ENCOUNTER — Telehealth: Payer: Self-pay | Admitting: Family Medicine

## 2022-09-08 NOTE — Telephone Encounter (Signed)
Called mother back. She clarified that PA is needed. Has 4 days left of medication. Pharmacy benefits via [place].  ID: [id number]. RxBIN: [id number] RXPCN: [id number] Grp: [id number] Phone: [phone]  He is on brand name Vimpat. Has tried/failed: generic. Had breakthrough seizures on generic.   Aware we will submit urgent PA request. Submitted urgent PA on covermymeds. Key: [place]. Waiting on determination.

## 2022-09-08 NOTE — Telephone Encounter (Signed)
[facility] called pharmacy and had them re-run claim. Confirmed they now get paid claim. I call mother back and informed her PA approved and should now be able to pick up med from pharmacy. She verbalized understanding and appreciation.

## 2022-09-08 NOTE — Telephone Encounter (Signed)
PA approved  09/05/2022 - 09/08/2023.

## 2022-09-08 NOTE — Telephone Encounter (Signed)
Pt's mother called stating that the pt is needing a refill and a PA for his VIMPAT 200 MG TABS tablet  Pt is low on the medication and can not go without it. Please advise.

## 2022-09-13 ENCOUNTER — Telehealth: Payer: Self-pay | Admitting: Neurology

## 2022-09-13 NOTE — Telephone Encounter (Signed)
Completed PA for Keppra on CMM and the response was "patient currently has access to the requested medication and a Prior Authorization is not needed for the patient/medication." [id number]

## 2022-09-23 DIAGNOSIS — G808 Other cerebral palsy: Secondary | ICD-10-CM | POA: Diagnosis not present

## 2022-10-03 ENCOUNTER — Other Ambulatory Visit: Payer: Self-pay

## 2022-10-03 ENCOUNTER — Telehealth: Payer: Self-pay

## 2022-10-03 ENCOUNTER — Other Ambulatory Visit: Payer: Self-pay | Admitting: Neurology

## 2022-10-03 MED ORDER — KEPPRA XR 750 MG PO TB24: 1500.0000 mg | ORAL_TABLET | Freq: Two times a day (BID) | ORAL | 5 refills | Status: DC

## 2022-10-03 MED ORDER — LAMOTRIGINE ER 300 MG PO TB24: 600.0000 mg | ORAL_TABLET | ORAL | 3 refills | Status: DC

## 2022-10-03 MED ORDER — LAMICTAL XR 300 MG PO TB24: 600.0000 mg | ORAL_TABLET | Freq: Every morning | ORAL | 1 refills | Status: DC

## 2022-10-03 NOTE — Telephone Encounter (Signed)
error 

## 2022-10-06 ENCOUNTER — Telehealth: Payer: Self-pay | Admitting: Family Medicine

## 2022-10-06 NOTE — Telephone Encounter (Signed)
Pharmacy is asking for a call with the status of the PA for pt's LAMICTAL XR 300 MG TB24 24 hour tablet

## 2022-10-07 ENCOUNTER — Other Ambulatory Visit (HOSPITAL_COMMUNITY): Payer: Self-pay

## 2022-10-10 NOTE — Telephone Encounter (Signed)
Pharmacy Patient Advocate Encounter   Received notification from [place] that prior authorization for LaMICtal XR '300MG'$  er tablets is required/requested.    PA submitted on 10/10/2022 to (ins) [employer] via [place] Status is pending  Submitted as an urgent request

## 2022-10-10 NOTE — Telephone Encounter (Signed)
Amy, can you confirm if it should be for generic or brand? Was not sure based on notes

## 2022-10-10 NOTE — Telephone Encounter (Signed)
[place] at [phone]. Spoke w/ Jarrett Soho. Lamictal XR '300mg'$  last refilled 09/06/2022 qty 60/30 days  (brand). Started filling 10/08/21.   Confirmed this rx requires a PA. Not on formulary.

## 2022-10-11 ENCOUNTER — Other Ambulatory Visit (HOSPITAL_COMMUNITY): Payer: Self-pay

## 2022-10-11 NOTE — Telephone Encounter (Signed)
Pharmacy Patient Advocate Encounter  Prior Authorization for LaMICtal XR '300MG'$  er tablets has been approved.    PA# PA Case ID: [id number] Effective dates: 09/05/2022 through 10/10/2023

## 2022-10-24 ENCOUNTER — Encounter: Payer: Self-pay | Admitting: Neurology

## 2022-10-24 ENCOUNTER — Ambulatory Visit (INDEPENDENT_AMBULATORY_CARE_PROVIDER_SITE_OTHER): Payer: Medicare Other | Admitting: Neurology

## 2022-10-24 VITALS — BP 124/79 | HR 70 | Ht 70.0 in

## 2022-10-24 DIAGNOSIS — G249 Dystonia, unspecified: Secondary | ICD-10-CM | POA: Diagnosis not present

## 2022-10-24 DIAGNOSIS — G40209 Localization-related (focal) (partial) symptomatic epilepsy and epileptic syndromes with complex partial seizures, not intractable, without status epilepticus: Secondary | ICD-10-CM

## 2022-10-24 DIAGNOSIS — Z4542 Encounter for adjustment and management of neuropacemaker (brain) (peripheral nerve) (spinal cord): Secondary | ICD-10-CM | POA: Diagnosis not present

## 2022-10-24 DIAGNOSIS — G40011 Localization-related (focal) (partial) idiopathic epilepsy and epileptic syndromes with seizures of localized onset, intractable, with status epilepticus: Secondary | ICD-10-CM

## 2022-10-24 DIAGNOSIS — G808 Other cerebral palsy: Secondary | ICD-10-CM | POA: Diagnosis not present

## 2022-10-24 MED ORDER — NAYZILAM 5 MG/0.1ML NA SOLN: 5.0000 mg | NASAL | 5 refills | Status: DC | PRN

## 2022-10-24 NOTE — Progress Notes (Signed)
Harry Carr is here  with mom, rm 2. Stated that she stopped the xanax pre the 7 pm time frame because she wasn't noticing much benefit. States that one week he may go almost 6 days without a seizure but then the next week he may have 4 in a row. Mom states he is coming out of them quicker.  Other Per VNS rep- increased normal mode- output current 1.371m-1.5mA, auto stim 1.5-1.625mA, magnet mode 1.625-1.75 mA. Educated to ensure they are swiping at least 2 times a day. Mom reports swiping 4-5 times a day   Chief Complaint  Patient presents with   Follow-up    Pt with mom, rm 2. Stated that she stopped the xanax pre the 7 pm time frame because she wasn't noticing much benefit. States that one week he may go almost 6 days without a seizure but then the next week he may have 4 in a row. Mom states he is coming out of them quicker.    Other    Per VNS rep- increased normal mode- output current 1.3768m1.5mA, auto stim 1.5-1.625mA, magnet mode 1.625-1.75 mA. VNS rep reports this should hopefully be therapeutic range for him. Educated to ensure they are swiping at least 2 times a day. Mom reports swiping 4-5 times a day    [facility]via CollumMD  cerebral palsy.   Has a VNS stimulator for seizure control.  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 MRN: [id number] CC:      Chief Complaint  Patient presents with   Follow-up      Pt with mom, rm 1141Here for VNS follow up. Increase frequency of seizures, up to 9/10 a month. Went to the ER  last  June 8th last year. Seen by AmDebbora Prestowice since.      Rv 10-24-2022:  with mother .  Management of VNS-  CoMarz Janiceeports the changes to his VNS have done him very good. He has been better controlled in terms of seizure, every 6 days .  Sometimes has seizures for 2 days in a row. Its no longer the 9 PM trigger, but rather a temperature trigger, a cold breeze can cause it.  Medication remained unchanged.  VNS out put increased today, magnet increased today,  He reports Headaches after seizures  See below 3 or more functions.    HPI  02-17-2022:  RV with Mom.  CoDeshun Urlacherresents for VNS interrogation and reprogramming of 4-5 settings. He was seen for a prolonged break through seizure ,which lasted beyond emergency medications. Swiping of the VNS magnet. [facility], but X ray has not confirmed. Gave Diazepam. Wrote for ATB , but not needed.  All seizures at 8 Pm.  His witching hour.      Today we will refill Nayzilam, as a nasal spray and I discussed use of cannabinol.  He is treated for acid reflux.   VNS : We will change the  settings_ up on 1.25 mA on VNS,  auto stim to 1.375 mA, Magnet to 1.5 mA. Magnet swipe increase to tid. Pulse width  250 microseconds. On time 30. Off time 3 minutes.    Xanax as a resume medication, 0.25 mg at 7 Pm to see if this prevents seizures.                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 [facility])             Past Surgical History:  Procedure Laterality Date   dorsal rhizotomy   age 52 yrs   VAGUS NERVE STIMULATOR INSERTION Left 07/14/2020    Procedure: VAGAL NERVE STIMULATOR IMPLANT;  Surgeon: Consuella Lose, MD;  Location: [place];  Service: Neurosurgery;  Laterality:  Left;  anterior   WISDOM TOOTH EXTRACTION   03/2010           Family History  Problem Relation Age of Onset   Heart attack Father     Heart disease Maternal Grandmother     Cancer - Other Maternal Grandfather          lukemia      Social History  Social History         Socioeconomic History   Marital status: Single      Spouse name: Not on file   Number of children: 0   Years of education: Not on file   Highest education level: Not on file  Occupational History   Not on file  Tobacco Use   Smoking status: Never   Smokeless tobacco: Never  Substance and Sexual Activity   Alcohol use: Never   Drug use: No   Sexual activity: Not Currently  Other Topics Concern   Not on file  Social History Narrative    Right handed. Caffeine none.  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    Social Determinants of Health    Financial Resource Strain: Not on file  Food Insecurity: Not on file  Transportation Needs: Not on file  Physical Activity: Not on file  Stress: Not on file  Social Connections: Not on file  Intimate Partner Violence: Not on file              Outpatient Medications Prior to Visit  Medication Sig Dispense Refill   aspirin 81 MG tablet Take 81 mg by mouth daily.       diazePAM, 20 MG Dose, (VALTOCO 20 MG DOSE) 2 x 10 MG/0.1ML LQPK Place 1 spray into the nose as needed (each nostril 1 spray in status epilepticus). 3 each 3   KEPPRA XR 750 MG TB24 Take 2 tablets (1,500 mg total) by mouth in the morning and at bedtime. 120 tablet 5   lacosamide (VIMPAT) 200 MG TABS tablet Take 1 tablet (200 mg total) by mouth 2 (two) times daily. 180 tablet 1   LamoTRIgine 300 MG TB24 24 hour tablet Take 2 tablets (600 mg total) by mouth every morning. 180 tablet 3   losartan (COZAAR) 100 MG tablet TAKE ONE TABLET BY MOUTH EVERY DAY 30 tablet 6   Multiple Vitamins-Minerals (MULTIVITAMIN WITH MINERALS) tablet Take 1 tablet by mouth  daily.       omeprazole (PRILOSEC) 20  MG capsule Take 1 capsule (20 mg total) by mouth every evening. 90 capsule 1   prochlorperazine (COMPAZINE) 25 MG suppository Place 1 suppository (25 mg total) rectally every 12 (twelve) hours as needed for nausea or vomiting. 12 suppository 0   VIMPAT 200 MG TABS tablet Take 1 tablet (200 mg total) by mouth 2 (two) times daily. 180 tablet 0   vitamin E 45 MG (100 UNITS) capsule Take 500 Units by mouth daily.        No facility-administered medications prior to visit.      ROS Review of Systems   Objective:    Objective  Physical Exam   BP 121/78   Pulse 70   Ht 5' 10"$  (1.778 m)   BMI 23.53 kg/m     Wt Readings from Last 3 Encounters:  09/09/21 164 lb (74.4 kg)  06/16/21 164 lb 8 oz (74.6 kg)  01/27/21 165 lb (74.8 kg)            Health Maintenance Due  Topic Date Due     Harry Carr is alert and oriented , in no distress, well groomed. Followed commands, open mouth and stick tongue out.  No edema, no rash.  Pupils are equal, facial asymmetry due to hemiparetic spasticity. Voice dysphonia.  2 plus DTR.  Wheelchair bound.   Left hand swan neck deformity of the fingers, right hand can  grip.   Needs special cups and feeds himself.          Assessment & Plan:    Today we will refill Nayzilam, as a nasal spray and I discussed use of cannabinol.  He is treated for acid reflux.   Xanax 0.25 mg po at 7 Pm for 21 days did not influence his seizure frequency. D/c          Follow-up:  6-8 months., and refills of all seizure meds are provided through [facility].  Cc Dr Elvia Collum.       Larey Seat, MD

## 2022-10-26 ENCOUNTER — Telehealth: Payer: Self-pay | Admitting: Neurology

## 2022-10-26 NOTE — Telephone Encounter (Signed)
Attempted PA for the pt for the nasal spray the response states pt can get the medication. PA note needed [id number]  "The patient currently has access to the requested medication and a Prior Authorization is not needed for the patient/medication."

## 2022-12-22 ENCOUNTER — Ambulatory Visit: Payer: Medicare Other | Admitting: Family Medicine

## 2022-12-30 DIAGNOSIS — G808 Other cerebral palsy: Secondary | ICD-10-CM | POA: Diagnosis not present

## 2023-01-24 ENCOUNTER — Telehealth: Payer: Self-pay

## 2023-01-24 NOTE — Telephone Encounter (Signed)
Received faxes from [facility] for patient Rx coverage approval. Approval authorization coverage from 09/05/21 - 03/15/23. MEM# [id number]

## 2023-01-25 NOTE — Progress Notes (Signed)
Subjective:  Patient ID: Harry Carr, male    DOB: 03/23/1981  Age: 42 y.o. MRN: [id number]  Chief Complaint  Patient presents with   Medical Management of Chronic Issues    HPI HYPERTENSION: Losartan 100 mg daily  GERD: Prilosec 20 mg daily  Epilepsy: Keppra XR 750 mg 2 tablets by mouth morning and before bed, Lamictal 300 mg 2 tablets daily  Last Levetiracetam level 06/21/22 50.9, Lacosamide 10.1, lamotrigine 11.3  Managed by Dr. Porfirio Mylar Dohmeier. Has seizures 2-4 times per week, but were more frequently. Medications: and vns have improved.  [place].       01/26/2023   11:03 AM 03/25/2022    9:06 AM 08/19/2020    9:48 AM 07/01/2014    3:22 PM  Depression screen PHQ 2/9  Decreased Interest 0 0 0 0  Down, Depressed, Hopeless 0 0 0 0  PHQ - 2 Score 0 0 0 0  Altered sleeping 0     Tired, decreased energy 0     Change in appetite 0     Feeling bad or failure about yourself  0     Trouble concentrating 0     Moving slowly or fidgety/restless 0     Suicidal thoughts 0     PHQ-9 Score 0     Difficult doing work/chores Not difficult at all         01/26/2023   11:03 AM  GAD 7 : Generalized Anxiety Score  Nervous, Anxious, on Edge 0  Control/stop worrying 0  Worry too much - different things 0  Trouble relaxing 0  Restless 0  Easily annoyed or irritable 0  Afraid - awful might happen 0  Total GAD 7 Score 0  Anxiety Difficulty Not difficult at all      Patient Care Team: Blane Ohara, MD as PCP - General (Family Medicine) Dohmeier, Porfirio Mylar, MD as Consulting Physician (Neurology)   Review of Systems  Constitutional:  Negative for chills, diaphoresis, fatigue and fever.  HENT:  Negative for congestion, ear pain and sore throat.   Respiratory:  Negative for cough and shortness of breath.   Cardiovascular:  Negative for chest pain and leg swelling.  Gastrointestinal:  Negative for abdominal pain, constipation, diarrhea, nausea and vomiting.  Genitourinary:   Negative for dysuria and urgency.  Musculoskeletal:  Negative for arthralgias and myalgias.  Skin:        Redness right great toe Removed a tick left posterior knee 01/24/2023.  Neurological:  Negative for dizziness and headaches.  Psychiatric/Behavioral:  Negative for dysphoric mood.     Current Outpatient Medications on File Prior to Visit  Medication Sig Dispense Refill   aspirin 81 MG tablet Take 81 mg by mouth daily.     KEPPRA XR 750 MG 24 hr tablet Take 2 tablets (1,500 mg total) by mouth in the morning and at bedtime. 120 tablet 5   LAMICTAL XR 300 MG TB24 24 hour tablet Take 2 tablets (600 mg total) by mouth in the morning. 180 tablet 1   losartan (COZAAR) 100 MG tablet TAKE ONE TABLET BY MOUTH EVERY DAY FOR BLOOD PRESSURE 90 tablet 2   Midazolam (NAYZILAM) 5 MG/0.1ML SOLN Place 5 mg into the nose as needed (Administer nasal spray at seizure onset. may use an additional nasal spray if the first is ineffective after 10 minutes. Do not exceed more than 2 sprays per seizure episode). 5 each 5   Multiple Vitamins-Minerals (MULTIVITAMIN WITH MINERALS) tablet Take 1 tablet  by mouth daily.     omeprazole (PRILOSEC) 20 MG capsule Take 1 capsule (20 mg total) by mouth every evening. 90 capsule 3   VIMPAT 200 MG TABS tablet Take 1 tablet (200 mg total) by mouth 2 (two) times daily. 180 tablet 1   vitamin E 45 MG (100 UNITS) capsule Take 500 Units by mouth daily.     No current facility-administered medications on file prior to visit.   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 [facility])    Past Surgical History:  Procedure Laterality Date   dorsal rhizotomy  age 88 yrs   VAGUS NERVE STIMULATOR INSERTION Left 07/14/2020   Procedure: VAGAL NERVE STIMULATOR IMPLANT;  Surgeon: Lisbeth Renshaw, MD;  Location: [facility] OR;  Service: Neurosurgery;  Laterality: Left;  anterior   WISDOM TOOTH EXTRACTION  03/2010    Family History  Problem Relation Age of Onset   Heart attack Father    Heart disease Maternal Grandmother    Cancer - Other Maternal Grandfather        lukemia   Social History   Socioeconomic History   Marital status: Single    Spouse name: Not on file   Number of children: 0   Years of education: Not on file   Highest education level: Not on file  Occupational History   Not on file  Tobacco Use   Smoking status: Never   Smokeless tobacco: Never  Substance and Sexual Activity   Alcohol use: Never   Drug use: No   Sexual activity: Not Currently  Other Topics Concern   Not on file  Social History Narrative   Right handed. Caffeine none.  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   Social Determinants of Health   Financial Resource Strain: Low Risk  (03/25/2022)   Overall Financial Resource Strain (CARDIA)    Difficulty of Paying Living Expenses: Not hard at all  Food Insecurity: No Food Insecurity (03/25/2022)   Hunger Vital Sign    Worried About Running Out of Food in the Last Year: Never true    Ran Out of Food in the Last Year: Never true  Transportation Needs: No Transportation Needs (03/25/2022)   PRAPARE - Administrator, Civil Service (Medical): No    Lack of Transportation (Non-Medical): No  Physical Activity: Not on file  Stress: No Stress Concern Present (03/25/2022)   [employer] of Occupational Health - Occupational Stress Questionnaire    Feeling of Stress : Not at all  Social Connections: Moderately Isolated (03/25/2022)   Social Connection and Isolation Panel [NHANES]    Frequency of Communication with Friends and Family: More than three times a week    Frequency of Social Gatherings with Friends and Family: More than three times a week    Attends Religious Services: More than 4 times per year    Active  Member of [employer] or Organizations: No    Attends Banker Meetings: Never    Marital Status: Never married    Objective:  BP 124/72   Pulse 78   Temp (!) 97.4 F (36.3 C)   Resp 18   Ht 5\' 10"  (1.778 m)   Wt 160 lb (72.6 kg)  BMI 22.96 kg/m      01/26/2023   10:56 AM 10/24/2022    2:48 PM 06/21/2022    9:39 AM  BP/Weight  Systolic BP 124 124 118  Diastolic BP 72 79 80  Wt. (Lbs) 160  164.5  BMI 22.96 kg/m2  23.6 kg/m2    Physical Exam Vitals reviewed.  Constitutional:      Appearance: Normal appearance. He is normal weight.  Cardiovascular:     Rate and Rhythm: Normal rate and regular rhythm.     Heart sounds: No murmur heard. Pulmonary:     Effort: Pulmonary effort is normal.     Breath sounds: Normal breath sounds.  Abdominal:     General: Abdomen is flat. Bowel sounds are normal.     Palpations: Abdomen is soft.     Tenderness: There is no abdominal tenderness.  Skin:    Comments: Scab posterior left knee at point of tick bite.  Mild redness right great toenail. No discharge. Mild tenderness.  Neurological:     Mental Status: He is alert and oriented to person, place, and time.  Psychiatric:        Mood and Affect: Mood normal.        Behavior: Behavior normal.     Diabetic Foot Exam - Simple   No data filed      Lab Results  Component Value Date   WBC 4.4 06/21/2022   HGB 15.4 06/21/2022   HCT 44.8 06/21/2022   PLT 251 06/21/2022   GLUCOSE 86 06/21/2022   CHOL 181 06/21/2022   TRIG 182 (H) 06/21/2022   HDL 41 06/21/2022   LDLCALC 108 (H) 06/21/2022   ALT 15 06/21/2022   AST 21 06/21/2022   NA 141 06/21/2022   K 4.2 06/21/2022   CL 102 06/21/2022   CREATININE 0.87 06/21/2022   BUN 7 06/21/2022   CO2 25 06/21/2022   INR 1.0 06/08/2020      Assessment & Plan:    Essential hypertension, benign Assessment & Plan: Well controlled.  No changes to medicines. Continue Losartan 100 mg daily Continue to work on eating a  healthy diet and exercise.  Labs drawn today.    Orders: -     CBC with Differential/Platelet -     Comprehensive metabolic panel -     Lipid panel  Generalized convulsive epilepsy [facility]) Assessment & Plan: Check labs. Management per specialist.    Orders: -     Lamotrigine level -     Levetiracetam level  Ingrown right big toenail Assessment & Plan: Doxycycline prescription. If does not resolve, call and we will schedule for removal.    Gastroesophageal reflux disease without esophagitis Assessment & Plan: Continue omeprazole.   Other orders -     Doxycycline Hyclate; Take 1 tablet (100 mg total) by mouth 2 (two) times daily.  Dispense: 14 tablet; Refill: 0     Meds ordered this encounter  Medications   doxycycline (VIBRA-TABS) 100 MG tablet    Sig: Take 1 tablet (100 mg total) by mouth 2 (two) times daily.    Dispense:  14 tablet    Refill:  0    Orders Placed This Encounter  Procedures   CBC with Differential/Platelet   Comprehensive metabolic panel   Lipid panel   Lamotrigine level   Levetiracetam level     Follow-up: Return in about 6 months (around 07/29/2023) for AWV AND CHRONIC VISIT.   I,Katherina A Bramblett,acting as a scribe for [employer]  Estel Scholze, MD.,have documented all relevant documentation on the behalf of Blane Ohara, MD,as directed by  Blane Ohara, MD while in the presence of Blane Ohara, MD.   An After Visit Summary was printed and given to the patient.  I attest that I have reviewed this visit and agree with the plan scribed by my staff.   Blane Ohara, MD [facility] [phone]

## 2023-01-25 NOTE — Assessment & Plan Note (Addendum)
Well controlled.  No changes to medicines.  Continue Losartan 100 mg daily.   Continue to work on eating a healthy diet and exercise.  Labs drawn today.   

## 2023-01-26 ENCOUNTER — Ambulatory Visit (INDEPENDENT_AMBULATORY_CARE_PROVIDER_SITE_OTHER): Payer: Medicare Other | Admitting: Family Medicine

## 2023-01-26 ENCOUNTER — Encounter: Payer: Self-pay | Admitting: Family Medicine

## 2023-01-26 VITALS — BP 124/72 | HR 78 | Temp 97.4°F | Resp 18 | Ht 70.0 in | Wt 160.0 lb

## 2023-01-26 DIAGNOSIS — K219 Gastro-esophageal reflux disease without esophagitis: Secondary | ICD-10-CM

## 2023-01-26 DIAGNOSIS — G40309 Generalized idiopathic epilepsy and epileptic syndromes, not intractable, without status epilepticus: Secondary | ICD-10-CM

## 2023-01-26 DIAGNOSIS — L600 Ingrowing nail: Secondary | ICD-10-CM

## 2023-01-26 DIAGNOSIS — I10 Essential (primary) hypertension: Secondary | ICD-10-CM | POA: Diagnosis not present

## 2023-01-26 MED ORDER — DOXYCYCLINE HYCLATE 100 MG PO TABS: 100.0000 mg | ORAL_TABLET | Freq: Two times a day (BID) | ORAL | 0 refills | Status: DC

## 2023-01-29 NOTE — Assessment & Plan Note (Signed)
Check labs. Management per specialist.

## 2023-01-30 ENCOUNTER — Encounter: Payer: Self-pay | Admitting: Family Medicine

## 2023-01-30 DIAGNOSIS — L600 Ingrowing nail: Secondary | ICD-10-CM | POA: Insufficient documentation

## 2023-01-30 DIAGNOSIS — K219 Gastro-esophageal reflux disease without esophagitis: Secondary | ICD-10-CM | POA: Insufficient documentation

## 2023-01-30 LAB — COMPREHENSIVE METABOLIC PANEL
ALT: 17 IU/L (ref 0–44)
AST: 25 IU/L (ref 0–40)
Albumin/Globulin Ratio: 2.1 (ref 1.2–2.2)
Albumin: 4.5 g/dL (ref 4.1–5.1)
Alkaline Phosphatase: 129 IU/L — ABNORMAL HIGH (ref 44–121)
BUN/Creatinine Ratio: 7 — ABNORMAL LOW (ref 9–20)
BUN: 7 mg/dL (ref 6–24)
Bilirubin Total: 0.7 mg/dL (ref 0.0–1.2)
CO2: 24 mmol/L (ref 20–29)
Calcium: 9.5 mg/dL (ref 8.7–10.2)
Chloride: 104 mmol/L (ref 96–106)
Creatinine, Ser: 0.94 mg/dL (ref 0.76–1.27)
Globulin, Total: 2.1 g/dL (ref 1.5–4.5)
Glucose: 79 mg/dL (ref 70–99)
Potassium: 4.3 mmol/L (ref 3.5–5.2)
Sodium: 143 mmol/L (ref 134–144)
Total Protein: 6.6 g/dL (ref 6.0–8.5)
eGFR: 105 mL/min/{1.73_m2} (ref >59)

## 2023-01-30 LAB — CBC WITH DIFFERENTIAL/PLATELET
Basophils Absolute: 0 10*3/uL (ref 0.0–0.2)
Basos: 0 %
EOS (ABSOLUTE): 0.1 10*3/uL (ref 0.0–0.4)
Eos: 2 %
Hematocrit: 43.5 % (ref 37.5–51.0)
Hemoglobin: 14.9 g/dL (ref 13.0–17.7)
Immature Grans (Abs): 0 10*3/uL (ref 0.0–0.1)
Immature Granulocytes: 0 %
Lymphocytes Absolute: 1.8 10*3/uL (ref 0.7–3.1)
Lymphs: 42 %
MCH: 31.2 pg (ref 26.6–33.0)
MCHC: 34.3 g/dL (ref 31.5–35.7)
MCV: 91 fL (ref 79–97)
Monocytes Absolute: 0.4 10*3/uL (ref 0.1–0.9)
Monocytes: 10 %
Neutrophils Absolute: 2 10*3/uL (ref 1.4–7.0)
Neutrophils: 46 %
Platelets: 243 10*3/uL (ref 150–450)
RBC: 4.77 x10E6/uL (ref 4.14–5.80)
RDW: 11.8 % (ref 11.6–15.4)
WBC: 4.4 10*3/uL (ref 3.4–10.8)

## 2023-01-30 LAB — LIPID PANEL
Chol/HDL Ratio: 4.5 ratio (ref 0.0–5.0)
Cholesterol, Total: 194 mg/dL (ref 100–199)
HDL: 43 mg/dL (ref >39)
LDL Chol Calc (NIH): 119 mg/dL — ABNORMAL HIGH (ref 0–99)
Triglycerides: 182 mg/dL — ABNORMAL HIGH (ref 0–149)
VLDL Cholesterol Cal: 32 mg/dL (ref 5–40)

## 2023-01-30 LAB — CARDIOVASCULAR RISK ASSESSMENT

## 2023-01-30 LAB — LAMOTRIGINE LEVEL: Lamotrigine Lvl: 13.3 ug/mL (ref 2.0–20.0)

## 2023-01-30 LAB — LEVETIRACETAM LEVEL: Levetiracetam Lvl: 52.5 ug/mL — ABNORMAL HIGH (ref 10.0–40.0)

## 2023-01-30 NOTE — Assessment & Plan Note (Signed)
Doxycycline prescription. If does not resolve, call and we will schedule for removal.

## 2023-01-30 NOTE — Assessment & Plan Note (Signed)
Continue omeprazole 

## 2023-02-01 ENCOUNTER — Telehealth: Payer: Self-pay

## 2023-02-01 ENCOUNTER — Ambulatory Visit (INDEPENDENT_AMBULATORY_CARE_PROVIDER_SITE_OTHER): Payer: Medicare Other | Admitting: Physician Assistant

## 2023-02-01 ENCOUNTER — Encounter: Payer: Self-pay | Admitting: Physician Assistant

## 2023-02-01 VITALS — BP 122/70 | HR 62 | Temp 97.2°F | Resp 14 | Ht 70.0 in | Wt 165.0 lb

## 2023-02-01 DIAGNOSIS — L600 Ingrowing nail: Secondary | ICD-10-CM | POA: Diagnosis not present

## 2023-02-01 MED ORDER — CEPHALEXIN 500 MG PO CAPS
500.0000 mg | ORAL_CAPSULE | Freq: Two times a day (BID) | ORAL | 0 refills | Status: DC
Start: 2023-02-01 — End: 2024-02-29

## 2023-02-01 NOTE — Progress Notes (Addendum)
Acute Office Visit  Subjective:    Patient ID: Harry Carr, male    DOB: [birth year], 42 y.o.   MRN: [id number]  Chief Complaint  Patient presents with   Toe Pain    Right foot   HPI: Patient is in today for right first ingrown toenail since one week and half. He took doxycycline for  7 days. Patient finished antibiotic, however toe still redness, tender to the touch.  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 [facility])     Past Surgical History:  Procedure Laterality Date   dorsal rhizotomy  age 70 yrs   VAGUS NERVE STIMULATOR INSERTION Left 07/14/2020   Procedure: VAGAL NERVE STIMULATOR IMPLANT;  Surgeon: Lisbeth Renshaw, MD;  Location: [facility] OR;  Service: Neurosurgery;  Laterality: Left;  anterior   WISDOM TOOTH EXTRACTION  03/2010    Family History  Problem Relation Age of Onset   Heart attack Father    Heart disease Maternal Grandmother    Cancer - Other Maternal Grandfather        lukemia    Social History   Socioeconomic History   Marital status: Single    Spouse name: Not on file   Number of children: 0   Years of education: Not on file   Highest education level: Not on file  Occupational History   Not on file  Tobacco Use   Smoking status: Never   Smokeless tobacco: Never  Substance and Sexual Activity   Alcohol use: Never   Drug use: No   Sexual activity: Not Currently  Other Topics Concern   Not on file  Social History Narrative   Right handed. Caffeine none.  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   Social Determinants of Health   Financial Resource Strain: Low Risk  (03/25/2022)   Overall Financial Resource Strain (CARDIA)    Difficulty of Paying Living Expenses: Not hard at all  Food Insecurity: No Food Insecurity  (03/25/2022)   Hunger Vital Sign    Worried About Running Out of Food in the Last Year: Never true    Ran Out of Food in the Last Year: Never true  Transportation Needs: No Transportation Needs (03/25/2022)   PRAPARE - Administrator, Civil Service (Medical): No    Lack of Transportation (Non-Medical): No  Physical Activity: Not on file  Stress: No Stress Concern Present (03/25/2022)   [employer] of Occupational Health - Occupational Stress Questionnaire    Feeling of Stress : Not at all  Social Connections: Moderately Isolated (03/25/2022)   Social Connection and Isolation Panel [NHANES]    Frequency of Communication with Friends and Family: More than three times a week    Frequency of Social Gatherings with Friends and Family: More than three times a week    Attends Religious Services: More than 4 times per year    Active Member of [employer] or Organizations: No    Attends Banker Meetings: Never    Marital Status: Never married  Intimate Partner Violence: Not At Risk (03/25/2022)   Humiliation, Afraid, Rape, and Kick questionnaire    Fear of Current or Ex-Partner: No    Emotionally Abused: No    Physically Abused:  No    Sexually Abused: No    Outpatient Medications Prior to Visit  Medication Sig Dispense Refill   aspirin 81 MG tablet Take 81 mg by mouth daily.     KEPPRA XR 750 MG 24 hr tablet Take 2 tablets (1,500 mg total) by mouth in the morning and at bedtime. 120 tablet 5   LAMICTAL XR 300 MG TB24 24 hour tablet Take 2 tablets (600 mg total) by mouth in the morning. 180 tablet 1   losartan (COZAAR) 100 MG tablet TAKE ONE TABLET BY MOUTH EVERY DAY FOR BLOOD PRESSURE 90 tablet 2   Midazolam (NAYZILAM) 5 MG/0.1ML SOLN Place 5 mg into the nose as needed (Administer nasal spray at seizure onset. may use an additional nasal spray if the first is ineffective after 10 minutes. Do not exceed more than 2 sprays per seizure episode). 5 each 5   Multiple  Vitamins-Minerals (MULTIVITAMIN WITH MINERALS) tablet Take 1 tablet by mouth daily.     omeprazole (PRILOSEC) 20 MG capsule Take 1 capsule (20 mg total) by mouth every evening. 90 capsule 3   VIMPAT 200 MG TABS tablet Take 1 tablet (200 mg total) by mouth 2 (two) times daily. 180 tablet 1   vitamin E 45 MG (100 UNITS) capsule Take 500 Units by mouth daily.     doxycycline (VIBRA-TABS) 100 MG tablet Take 1 tablet (100 mg total) by mouth 2 (two) times daily. 14 tablet 0   No facility-administered medications prior to visit.    No Known Allergies  Review of Systems  Constitutional:  Negative for chills, fatigue, fever and unexpected weight change.  HENT:  Negative for congestion, ear pain, sinus pain and sore throat.   Respiratory:  Negative for cough and shortness of breath.   Cardiovascular:  Negative for chest pain and palpitations.  Gastrointestinal:  Negative for abdominal pain, blood in stool, constipation, diarrhea, nausea and vomiting.  Endocrine: Negative for polydipsia.  Genitourinary:  Negative for dysuria.  Musculoskeletal:  Negative for back pain.  Skin:  Positive for color change (Right first toe). Negative for rash.       Right first toe redness, tenderness.  Neurological:  Negative for headaches.       Objective:        02/01/2023    2:34 PM 01/26/2023   10:56 AM 10/24/2022    2:48 PM  Vitals with BMI  Height 5\' 10"  5\' 10"  5\' 10"   Weight 165 lbs 160 lbs   BMI 23.68 22.96   Systolic 122 124 102  Diastolic 70 72 79  Pulse 62 78 70    No data found.   Physical Exam Constitutional:      Appearance: Normal appearance.  Eyes:     Conjunctiva/sclera: Conjunctivae normal.  Cardiovascular:     Rate and Rhythm: Normal rate and regular rhythm.     Heart sounds: Normal heart sounds.  Pulmonary:     Effort: Pulmonary effort is normal.     Breath sounds: Normal breath sounds.  Abdominal:     General: Bowel sounds are normal.     Palpations: Abdomen is soft.      Tenderness: There is no abdominal tenderness.  Musculoskeletal:     Comments: Right first toenail: erythema, tender, discharge.   Neurological:     Mental Status: He is alert and oriented to person, place, and time.  Psychiatric:        Behavior: Behavior normal.     Health Maintenance Due  Topic Date Due   COVID-19 Vaccine (1) Never done   Hepatitis C Screening  Never done   DTaP/Tdap/Td (1 - Tdap) Never done   Medicare Annual Wellness (AWV)  03/25/2023    There are no preventive care reminders to display for this patient.   No results found for: "TSH" Lab Results  Component Value Date   WBC 4.4 01/26/2023   HGB 14.9 01/26/2023   HCT 43.5 01/26/2023   MCV 91 01/26/2023   PLT 243 01/26/2023   Lab Results  Component Value Date   NA 143 01/26/2023   K 4.3 01/26/2023   CO2 24 01/26/2023   GLUCOSE 79 01/26/2023   BUN 7 01/26/2023   CREATININE 0.94 01/26/2023   BILITOT 0.7 01/26/2023   ALKPHOS 129 (H) 01/26/2023   AST 25 01/26/2023   ALT 17 01/26/2023   PROT 6.6 01/26/2023   ALBUMIN 4.5 01/26/2023   CALCIUM 9.5 01/26/2023   ANIONGAP 9 07/14/2020   EGFR 105 01/26/2023   Lab Results  Component Value Date   CHOL 194 01/26/2023   Lab Results  Component Value Date   HDL 43 01/26/2023   Lab Results  Component Value Date   LDLCALC 119 (H) 01/26/2023   Lab Results  Component Value Date   TRIG 182 (H) 01/26/2023   Lab Results  Component Value Date   CHOLHDL 4.5 01/26/2023   No results found for: "HGBA1C"     Assessment & Plan:  Ingrown right big toenail Assessment & Plan: Procedure: Risks and benefits discussed with patient including bleeding, continued or worsening infection, failure of toenail to regrow.  Patient agreed to procedure.  Toe cleansed with alcohol. Rubber band tourniquet applied.  Anesthesia: digital block at DIP: Lidocaine without epinephrine 5-10 ml.  Entire toenail removed.  Tourniquet removed.  Compression dressing applied.   Complications: none.  Antibiotic prescribed: keflex 500 twice daily x 7 days.  Orders: -     Cephalexin; Take 1 capsule (500 mg total) by mouth 2 (two) times daily.  Dispense: 20 capsule; Refill: 0     Meds ordered this encounter  Medications   cephALEXin (KEFLEX) 500 MG capsule    Sig: Take 1 capsule (500 mg total) by mouth 2 (two) times daily.    Dispense:  20 capsule    Refill:  0    No orders of the defined types were placed in this encounter.    Follow-up: Return if symptoms worsen or fail to improve.  An After Visit Summary was printed and given to the patient.  I, Yamaguchi Gauss, hereby attest that the medical record entry for 02/01/2023 accurately reflects signatures/notations that I made in my capacity as PA when I treated/diagnosed the above listed Medicare beneficiary. I do hereby attest that this information is true, accurate and complete to the best of my knowledge and I understand that any falsification, omission, or concealment of material fact may subject me to administrative, civil, or criminal liability.  Efferson Gauss, [facility] [phone]

## 2023-02-01 NOTE — Telephone Encounter (Signed)
Patient's mother called stating that toe was not better and today is the last day of his antibiotic and the redness is spread on his toe. She is calling to see if they need to bring patient back in. Please advise.

## 2023-02-01 NOTE — Assessment & Plan Note (Signed)
Procedure: Risks and benefits discussed with patient including bleeding, continued or worsening infection, failure of toenail to regrow.  Patient agreed to procedure.  Toe cleansed with alcohol. Rubber band tourniquet applied.  Anesthesia: digital block at DIP: Lidocaine without epinephrine 5-10 ml.  Entire toenail removed.  Tourniquet removed.  Compression dressing applied.  Complications: none.  Antibiotic prescribed: keflex 500 twice daily x 7 days.

## 2023-02-01 NOTE — Telephone Encounter (Signed)
Patient will be seen at the office at 2:20 pm

## 2023-02-27 ENCOUNTER — Telehealth: Payer: Self-pay

## 2023-02-27 ENCOUNTER — Other Ambulatory Visit: Payer: Self-pay | Admitting: Anesthesiology

## 2023-02-27 ENCOUNTER — Other Ambulatory Visit: Payer: Self-pay

## 2023-02-27 MED ORDER — VIMPAT 200 MG PO TABS: 200.0000 mg | ORAL_TABLET | Freq: Two times a day (BID) | ORAL | 2 refills | Status: DC

## 2023-02-27 MED ORDER — OMEPRAZOLE 20 MG PO CPDR: 20.0000 mg | DELAYED_RELEASE_CAPSULE | Freq: Every evening | ORAL | 0 refills | Status: DC

## 2023-02-27 MED ORDER — VIMPAT 200 MG PO TABS: 200.0000 mg | ORAL_TABLET | Freq: Two times a day (BID) | ORAL | 1 refills | Status: DC

## 2023-02-27 NOTE — Telephone Encounter (Signed)
Called pt's mother and asked her if pt has still been taking his Vimpat and she stated yes. Vimpat was last dispensed 01/07/2023.   Pt last seen 10/24/2022 Upcoming Appointment 04/24/2023

## 2023-02-27 NOTE — Telephone Encounter (Signed)
Last seen 10/24/2022 Upcoming Appointment 04/24/2023  Vimpat Last Filled 01/07/2023  Called pt's mother and asked in pt is still taking his Vimpat and she stated that pt is still taking his Vimpat and he is in need for a refill. Told pt that we will work on it and get his refill sent in.

## 2023-03-28 ENCOUNTER — Other Ambulatory Visit: Payer: Self-pay | Admitting: Anesthesiology

## 2023-03-28 MED ORDER — LAMICTAL XR 300 MG PO TB24: 600.0000 mg | ORAL_TABLET | Freq: Every morning | ORAL | 1 refills | Status: DC

## 2023-03-28 MED ORDER — KEPPRA XR 750 MG PO TB24: 1500.0000 mg | ORAL_TABLET | Freq: Two times a day (BID) | ORAL | 5 refills | Status: DC

## 2023-04-04 ENCOUNTER — Other Ambulatory Visit: Payer: Self-pay

## 2023-04-04 DIAGNOSIS — I10 Essential (primary) hypertension: Secondary | ICD-10-CM

## 2023-04-04 MED ORDER — LOSARTAN POTASSIUM 100 MG PO TABS
100.0000 mg | ORAL_TABLET | Freq: Every day | ORAL | 1 refills | Status: DC
Start: 2023-04-04 — End: 2023-09-25

## 2023-04-24 ENCOUNTER — Ambulatory Visit: Payer: Medicare Other | Admitting: Family Medicine

## 2023-05-29 ENCOUNTER — Other Ambulatory Visit: Payer: Self-pay

## 2023-05-29 ENCOUNTER — Telehealth: Payer: Self-pay | Admitting: Neurology

## 2023-05-29 MED ORDER — OMEPRAZOLE 20 MG PO CPDR: 20.0000 mg | DELAYED_RELEASE_CAPSULE | Freq: Every evening | ORAL | 0 refills | Status: DC

## 2023-05-29 NOTE — Telephone Encounter (Addendum)
Pt mother, Alvia Mark reschedule appt due to scheduling conflict, son having surgery, unable to come to appt. Would like a call from the nurse to discuss if that appt on 12/13/23 at 1:00pm is that going to be ok with his VNS implant.

## 2023-05-29 NOTE — Telephone Encounter (Signed)
Returned pt's mother's Erskine Squibb call regarding pt's follow up appointment being cancelled due to a scheduling conflict. Pt's mother stated pt is not having surgery, that was an error in message. She wanted to know if it would be ok for pt to wait until April's appointment for his VNS to be checked. She stated pt is having seizures every six days, which is a "normal" for him. Pt's mother was advised that we will let her know if we have an opening sooner than April for pt's follow up. Pt's mother prefers an appointment in the pm since they have to drive an hour to get here.

## 2023-06-06 ENCOUNTER — Ambulatory Visit: Payer: Medicare Other | Admitting: Family Medicine

## 2023-06-16 DIAGNOSIS — G808 Other cerebral palsy: Secondary | ICD-10-CM | POA: Diagnosis not present

## 2023-07-12 ENCOUNTER — Other Ambulatory Visit: Payer: Self-pay

## 2023-07-12 MED ORDER — NAYZILAM 5 MG/0.1ML NA SOLN: 5.0000 mg | NASAL | 5 refills | Status: DC | PRN

## 2023-08-02 ENCOUNTER — Encounter: Payer: Medicare Other | Admitting: Family Medicine

## 2023-09-04 ENCOUNTER — Other Ambulatory Visit: Payer: Self-pay | Admitting: Neurology

## 2023-09-04 MED ORDER — LAMICTAL XR 300 MG PO TB24: 600.0000 mg | ORAL_TABLET | Freq: Every morning | ORAL | 1 refills | Status: DC

## 2023-09-04 MED ORDER — VIMPAT 200 MG PO TABS: 200.0000 mg | ORAL_TABLET | Freq: Two times a day (BID) | ORAL | 2 refills | Status: DC

## 2023-09-04 NOTE — Telephone Encounter (Signed)
Pt's mother requesting refill of VIMPAT 200 MG TABS tablet LAMICTAL XR 300 MG TB24 24 hour tablet and KEPPRA XR 750 MG 24 hr tablet Send to [facility]   Also states VIMPAT 200 MG TABS tablet will need a PA after 09/08/2023

## 2023-09-07 ENCOUNTER — Other Ambulatory Visit: Payer: Self-pay

## 2023-09-11 ENCOUNTER — Other Ambulatory Visit: Payer: Self-pay

## 2023-09-11 MED ORDER — OMEPRAZOLE 20 MG PO CPDR: 20.0000 mg | DELAYED_RELEASE_CAPSULE | Freq: Every evening | ORAL | 0 refills | Status: DC

## 2023-09-19 NOTE — Progress Notes (Signed)
No show. Dr. Sedalia Muta

## 2023-09-19 NOTE — Patient Instructions (Signed)
 Health Maintenance, Male  Adopting a healthy lifestyle and getting preventive care are important in promoting health and wellness. Ask your health care provider about:  The right schedule for you to have regular tests and exams.  Things you can do on your own to prevent diseases and keep yourself healthy.  What should I know about diet, weight, and exercise?  Eat a healthy diet    Eat a diet that includes plenty of vegetables, fruits, low-fat dairy products, and lean protein.  Do not eat a lot of foods that are high in solid fats, added sugars, or sodium.  Maintain a healthy weight  Body mass index (BMI) is a measurement that can be used to identify possible weight problems. It estimates body fat based on height and weight. Your health care provider can help determine your BMI and help you achieve or maintain a healthy weight.  Get regular exercise  Get regular exercise. This is one of the most important things you can do for your health. Most adults should:  Exercise for at least 150 minutes each week. The exercise should increase your heart rate and make you sweat (moderate-intensity exercise).  Do strengthening exercises at least twice a week. This is in addition to the moderate-intensity exercise.  Spend less time sitting. Even light physical activity can be beneficial.  Watch cholesterol and blood lipids  Have your blood tested for lipids and cholesterol at 42 years of age, then have this test every 5 years.  You may need to have your cholesterol levels checked more often if:  Your lipid or cholesterol levels are high.  You are older than 42 years of age.  You are at high risk for heart disease.  What should I know about cancer screening?  Many types of cancers can be detected early and may often be prevented. Depending on your health history and family history, you may need to have cancer screening at various ages. This may include screening for:  Colorectal cancer.  Prostate cancer.  Skin cancer.  Lung  cancer.  What should I know about heart disease, diabetes, and high blood pressure?  Blood pressure and heart disease  High blood pressure causes heart disease and increases the risk of stroke. This is more likely to develop in people who have high blood pressure readings or are overweight.  Talk with your health care provider about your target blood pressure readings.  Have your blood pressure checked:  Every 3-5 years if you are 9-95 years of age.  Every year if you are 85 years old or older.  If you are between the ages of 29 and 29 and are a current or former smoker, ask your health care provider if you should have a one-time screening for abdominal aortic aneurysm (AAA).  Diabetes  Have regular diabetes screenings. This checks your fasting blood sugar level. Have the screening done:  Once every three years after age 23 if you are at a normal weight and have a low risk for diabetes.  More often and at a younger age if you are overweight or have a high risk for diabetes.  What should I know about preventing infection?  Hepatitis B  If you have a higher risk for hepatitis B, you should be screened for this virus. Talk with your health care provider to find out if you are at risk for hepatitis B infection.  Hepatitis C  Blood testing is recommended for:  Everyone born from [birth year] through 1964.  Anyone  with known risk factors for hepatitis C.  Sexually transmitted infections (STIs)  You should be screened each year for STIs, including gonorrhea and chlamydia, if:  You are sexually active and are younger than 42 years of age.  You are older than 42 years of age and your health care provider tells you that you are at risk for this type of infection.  Your sexual activity has changed since you were last screened, and you are at increased risk for chlamydia or gonorrhea. Ask your health care provider if you are at risk.  Ask your health care provider about whether you are at high risk for HIV. Your health care provider  may recommend a prescription medicine to help prevent HIV infection. If you choose to take medicine to prevent HIV, you should first get tested for HIV. You should then be tested every 3 months for as long as you are taking the medicine.  Follow these instructions at home:  Alcohol use  Do not drink alcohol if your health care provider tells you not to drink.  If you drink alcohol:  Limit how much you have to 0-2 drinks a day.  Know how much alcohol is in your drink. In the [place] one drink equals one 12 oz bottle of beer (355 mL), one 5 oz glass of wine (148 mL), or one 1 oz glass of hard liquor (44 mL).  Lifestyle  Do not use any products that contain nicotine or tobacco. These products include cigarettes, chewing tobacco, and vaping devices, such as e-cigarettes. If you need help quitting, ask your health care provider.  Do not use street drugs.  Do not share needles.  Ask your health care provider for help if you need support or information about quitting drugs.  General instructions  Schedule regular health, dental, and eye exams.  Stay current with your vaccines.  Tell your health care provider if:  You often feel depressed.  You have ever been abused or do not feel safe at home.  Summary  Adopting a healthy lifestyle and getting preventive care are important in promoting health and wellness.  Follow your health care provider's instructions about healthy diet, exercising, and getting tested or screened for diseases.  Follow your health care provider's instructions on monitoring your cholesterol and blood pressure.  This information is not intended to replace advice given to you by your health care provider. Make sure you discuss any questions you have with your health care provider.  Document Revised: 01/11/2021 Document Reviewed: 01/11/2021  Elsevier Patient Education  2024 [employer].

## 2023-09-20 ENCOUNTER — Encounter: Payer: Medicare Other | Admitting: Family Medicine

## 2023-09-20 DIAGNOSIS — Z0000 Encounter for general adult medical examination without abnormal findings: Secondary | ICD-10-CM

## 2023-09-25 ENCOUNTER — Other Ambulatory Visit: Payer: Self-pay

## 2023-09-25 DIAGNOSIS — I10 Essential (primary) hypertension: Secondary | ICD-10-CM

## 2023-09-25 MED ORDER — LOSARTAN POTASSIUM 100 MG PO TABS
100.0000 mg | ORAL_TABLET | Freq: Every day | ORAL | 0 refills | Status: DC
Start: 2023-09-25 — End: 2023-12-29

## 2023-10-06 DIAGNOSIS — G808 Other cerebral palsy: Secondary | ICD-10-CM | POA: Diagnosis not present

## 2023-10-09 ENCOUNTER — Telehealth: Payer: Self-pay | Admitting: Neurology

## 2023-10-09 MED ORDER — KEPPRA XR 750 MG PO TB24: 1500.0000 mg | ORAL_TABLET | Freq: Two times a day (BID) | ORAL | 3 refills | Status: DC

## 2023-10-09 NOTE — Telephone Encounter (Signed)
[facility]) requesting refill for KEPPRA XR 750 MG 24 hr tablet.  Had faxed request on 10/02/23 had not received a response.

## 2023-10-09 NOTE — Telephone Encounter (Signed)
Last seen on 10/24/22 Follow up scheduled 12/13/23 Rx sent.

## 2023-10-11 ENCOUNTER — Telehealth: Payer: Self-pay | Admitting: Neurology

## 2023-10-11 NOTE — Telephone Encounter (Signed)
 Harry Carr ([place]) PA Case ID #: [id number] Rx #: [id number]  PA Vimpat  complete waiting on approval

## 2023-10-11 NOTE — Telephone Encounter (Signed)
 Baruch Likens from [facility] reports pt has taken his last VIMPAT  200 MG TABS tablet.  She has submited the PA thru [facility], she is asking if this can be worked with priority since pt is out of this medication.

## 2023-10-11 NOTE — Telephone Encounter (Signed)
 Pt's mother called wanting to know when the pt's VIMPAT  200 MG TABS tablet will be called in because the pt took his last one this morning and he has been having seizures and they do not want to end up having to take him to the ER. Please advise.

## 2023-10-11 NOTE — Telephone Encounter (Addendum)
 Outcome Approved today by [facility] NCPDP 2017 Your request has been approved Authorization Expiration Date: 10/10/2024  PA Vimpat  approved  Mom aware of approval

## 2023-11-03 ENCOUNTER — Telehealth: Payer: Self-pay

## 2023-11-03 ENCOUNTER — Telehealth: Payer: Self-pay | Admitting: Neurology

## 2023-11-03 ENCOUNTER — Other Ambulatory Visit (HOSPITAL_COMMUNITY): Payer: Self-pay

## 2023-11-03 NOTE — Telephone Encounter (Signed)
 Pt's mother reports that pharmacy has informed her that pt's LAMICTAL XR 300 MG TB24 24 hour tablet needs a PA .  Pt's mother reports pt has enough medication until Tues. Please call.

## 2023-11-03 NOTE — Telephone Encounter (Signed)
 PA request has been Submitted. New Encounter created for follow up. For additional info see Pharmacy Prior Auth telephone encounter from 11/03/2023.

## 2023-11-03 NOTE — Telephone Encounter (Signed)
 Pharmacy Patient Advocate Encounter   Received notification from Pt Calls Messages that prior authorization for LaMICtal XR 300MG  er tablets is required/requested.   Insurance verification completed.   The patient is insured through [facility] Medicare .   Per test claim: PA required; PA submitted to above mentioned insurance via CoverMyMeds Key/confirmation #/EOC [facility] Status is pending

## 2023-11-06 NOTE — Telephone Encounter (Signed)
 Pharmacy Patient Advocate Encounter  Received notification from [facility] that Prior Authorization for LaMICtal XR 300MG  er tablets  has been APPROVED from 11/03/2023 to 11/02/2024   PA #/Case ID/Reference #: [id number]

## 2023-12-11 NOTE — Patient Instructions (Incomplete)
 Below is our plan:  We will continue current treatment plan. Continue swiping VNS at bedtime and with medications.   Please make sure you are consistent with timing of seizure medication. I recommend annual visit with primary care provider (PCP) for complete physical and routine blood work. I recommend daily intake of vitamin D (400-800iu) and calcium (800-1000mg ) for bone health. Discuss Dexa screening with PCP.   According to [place] law, you can not drive unless you are seizure / syncope free for at least 6 months and under physician's care.  Please maintain precautions. Do not participate in activities where a loss of awareness could harm you or someone else. No swimming alone, no tub bathing, no hot tubs, no driving, no operating motorized vehicles (cars, ATVs, motocycles, etc), lawnmowers, power tools or firearms. No standing at heights, such as rooftops, ladders or stairs. Avoid hot objects such as stoves, heaters, open fires. Wear a helmet when riding a bicycle, scooter, skateboard, etc. and avoid areas of traffic. Set your water heater to 120 degrees or less.  SUDEP is the sudden, unexpected death of someone with epilepsy, who was otherwise healthy. In SUDEP cases, no other cause of death is found when an autopsy is done. Each year, more than 1 in 1,000 people with epilepsy die from SUDEP. This is the leading cause of death in people with uncontrolled seizures. Until further answers are available, the best way to prevent SUDEP is to lower your risk by controlling seizures. Research has found that people with all types of epilepsy that experience convulsive seizures can be at risk.  Please make sure you are staying well hydrated. I recommend 50-60 ounces daily. Well balanced diet and regular exercise encouraged. Consistent sleep schedule with 6-8 hours recommended.   Please continue follow up with care team as directed.   Follow up with Dr Vickey Huger pending discussion of next steps.   You may  receive a survey regarding today's visit. I encourage you to leave honest feed back as I do use this information to improve patient care. Thank you for seeing me today!

## 2023-12-11 NOTE — Progress Notes (Unsigned)
 No chief complaint on file.   HISTORY OF PRESENT ILLNESS:  12/11/23 ALL:  Dontavis returns for follow up for seizures. He was last seen by Dr Vickey Huger 10/2022. VNS settings adjusted. He continues regular follow up with Dr Thad Ranger for cerebral palsy.   Since, he continues Keppra XR 1500mg  BID, Lamictal XR 600mg  daily and Vimpat 200mg  BID. Brand of all medically necessary.   06/20/2022 ALL:  Harry Carr is a 43 y.o. male here today for follow up for seizures. He returns with his mother who aids in history. He continues Vimpat (Brand) 200mg  BID, Keppra (Brand) 1500mg  BID and lamotrigine 300mg  TB24 QD. He is tolerating meds. Mom added alprazolam 0.25mg  at 7pm. She reports he went 9 days without a seizure after starting. She thought that seizures seemed a little less frequent over the first month but seizures have increased in frequency over the past three months. He seems to be having at least one event every day, sometimes two. She is swiping magnet at least once daily and then prior t, during and following every seizure event. She reports he will complain of a headache then she notes head turns to one side and eyes are deviated. Event usually last a couple minutes. She uses Nayzilam if event last longer. She averages 1-2 doses per month. Headache resolves spontaneously following event. No obvious post ictal state. He is followed closely by PCP. He has fasting labs scheduled for tomorrow. He seems to tolerate VNS. No significant sore throat or worsening cough. He continues omeprazole as needed for GERD. He is taking MVI daily. He is a picky eater.  HISTORY (copied from Dr Dohmeier's previous note)  Ladon Applebaum presents for VNS interrogation and reprogramming of 4-5 settings. He was seen for a prolonged break through seizure ,which lasted beyond emergency medications. Swiping of the VNS magnet. The ED in [facility] used the term aspiration Pneumonia, but X ray has not confirmed. Gave Diazepam. Wrote  for ATB , but not needed.  All seizures at 8 Pm.  His witching hour.    Today we will refill Nayzilam, as a nasal spray and I discussed use of cannabinol.  He is treated for acid reflux.   VNS : We will change the settings_ up on 1.25 mA on VNS,  auto stim to 1.375 mA, Magnet to 1.5 mA. Magnet swipe increase to tid. Pulse width  250 microseconds. On time 30. Off time 3 minutes.    Xanax as a resume medication, 0.25 mg at 7 Pm to see if this prevents seizures   REVIEW OF SYSTEMS: Out of a complete 14 system review of symptoms, the patient complains only of the following symptoms, seizures, headaches, and all other reviewed systems are negative.   ALLERGIES: No Known Allergies   HOME MEDICATIONS: Outpatient Medications Prior to Visit  Medication Sig Dispense Refill   aspirin 81 MG tablet Take 81 mg by mouth daily.     cephALEXin (KEFLEX) 500 MG capsule Take 1 capsule (500 mg total) by mouth 2 (two) times daily. 20 capsule 0   KEPPRA XR 750 MG 24 hr tablet Take 2 tablets (1,500 mg total) by mouth in the morning and at bedtime. 120 tablet 3   LAMICTAL XR 300 MG TB24 24 hour tablet Take 2 tablets (600 mg total) by mouth in the morning. 180 tablet 1   losartan (COZAAR) 100 MG tablet Take 1 tablet (100 mg total) by mouth daily. for blood pressure 90 tablet 0  Midazolam (NAYZILAM) 5 MG/0.1ML SOLN Place 5 mg into the nose as needed (Administer nasal spray at seizure onset. may use an additional nasal spray if the first is ineffective after 10 minutes. Do not exceed more than 2 sprays per seizure episode). 5 each 5   Multiple Vitamins-Minerals (MULTIVITAMIN WITH MINERALS) tablet Take 1 tablet by mouth daily.     omeprazole (PRILOSEC) 20 MG capsule Take 1 capsule (20 mg total) by mouth every evening. 90 capsule 0   VIMPAT 200 MG TABS tablet Take 1 tablet (200 mg total) by mouth 2 (two) times daily. 180 tablet 2   vitamin E 45 MG (100 UNITS) capsule Take 500 Units by mouth daily.     No  facility-administered medications prior to visit.     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)      PAST SURGICAL HISTORY: Past Surgical History:  Procedure Laterality Date   dorsal rhizotomy  age 89 yrs   VAGUS NERVE STIMULATOR INSERTION Left 07/14/2020   Procedure: VAGAL NERVE STIMULATOR IMPLANT;  Surgeon: Lisbeth Renshaw, MD;  Location: [facility] OR;  Service: Neurosurgery;  Laterality: Left;  anterior   WISDOM TOOTH EXTRACTION  03/2010     FAMILY HISTORY: Family History  Problem Relation Age of Onset   Heart attack Father    Heart disease Maternal Grandmother    Cancer - Other Maternal Grandfather        lukemia     SOCIAL HISTORY: Social History   Socioeconomic History   Marital status: Single    Spouse name: Not on file   Number of children: 0   Years of education: Not on file   Highest education level: Not on file  Occupational History   Not on file  Tobacco Use   Smoking status: Never   Smokeless tobacco: Never  Substance and Sexual Activity   Alcohol use: Never   Drug use: No   Sexual activity: Not Currently  Other Topics Concern   Not on file  Social History Narrative   Right handed. Caffeine none.  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   Social Drivers of Corporate investment banker Strain: Low Risk  (03/25/2022)   Overall Financial Resource Strain (CARDIA)    Difficulty of Paying Living Expenses: Not hard at all  Food Insecurity: No Food Insecurity (03/25/2022)   Hunger Vital Sign    Worried About Running Out of Food in the Last Year: Never true    Ran Out of Food in the Last Year: Never true  Transportation Needs: No Transportation Needs (03/25/2022)   PRAPARE - Scientist, research (physical sciences) (Medical): No    Lack of Transportation (Non-Medical): No  Physical Activity: Not on file  Stress: No Stress Concern Present (03/25/2022)   [employer] of Occupational Health - Occupational Stress Questionnaire    Feeling of Stress : Not at all  Social Connections: Moderately Isolated (03/25/2022)   Social Connection and Isolation Panel [NHANES]    Frequency of Communication with Friends and Family: More than three times a week    Frequency of Social Gatherings with Friends and Family: More than three times a week    Attends  Religious Services: More than 4 times per year    Active Member of Clubs or Organizations: No    Attends Banker Meetings: Never    Marital Status: Never married  Intimate Partner Violence: Not At Risk (03/25/2022)   Humiliation, Afraid, Rape, and Kick questionnaire    Fear of Current or Ex-Partner: No    Emotionally Abused: No    Physically Abused: No    Sexually Abused: No     PHYSICAL EXAM  There were no vitals filed for this visit.  There is no height or weight on file to calculate BMI.  Generalized: Well developed, in no acute distress  Cardiology: normal rate and rhythm, no murmur auscultated  Respiratory: clear to auscultation bilaterally    Neurological examination  Mentation: Alert, developmentally delayed. Follows all commands, mild dysarthria Cranial nerve II-XII: Pupils were equal round reactive to light. Extraocular movements were limited, abnormal rotation of neck. Facial sensation and strength were normal. Head turning and shoulder shrug  were normal and symmetric. Motor: The motor testing reveals 5 over 5 strength of all 4 extremities. 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  Gait and station: Gait into assessed, in wheelchair    DIAGNOSTIC DATA (LABS, IMAGING, TESTING) - I reviewed patient records, labs, notes, testing and imaging myself where available.  Lab Results  Component Value Date   WBC 4.4  01/26/2023   HGB 14.9 01/26/2023   HCT 43.5 01/26/2023   MCV 91 01/26/2023   PLT 243 01/26/2023      Component Value Date/Time   NA 143 01/26/2023 1140   K 4.3 01/26/2023 1140   CL 104 01/26/2023 1140   CO2 24 01/26/2023 1140   GLUCOSE 79 01/26/2023 1140   GLUCOSE 99 07/14/2020 0648   BUN 7 01/26/2023 1140   CREATININE 0.94 01/26/2023 1140   CALCIUM 9.5 01/26/2023 1140   PROT 6.6 01/26/2023 1140   ALBUMIN 4.5 01/26/2023 1140   AST 25 01/26/2023 1140   ALT 17 01/26/2023 1140   ALKPHOS 129 (H) 01/26/2023 1140   BILITOT 0.7 01/26/2023 1140   GFRNONAA 109 08/19/2020 1019   GFRNONAA >60 07/14/2020 0648   GFRAA 126 08/19/2020 1019   Lab Results  Component Value Date   CHOL 194 01/26/2023   HDL 43 01/26/2023   LDLCALC 119 (H) 01/26/2023   TRIG 182 (H) 01/26/2023   CHOLHDL 4.5 01/26/2023   No results found for: "HGBA1C" No results found for: "VITAMINB12" No results found for: "TSH"      No data to display               No data to display           ASSESSMENT AND PLAN  42 y.o. 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 here with    No diagnosis found.  Jezreel Justiniano is tolerating AEDs and VNS. He has had continued breakthrough activity despite prophylactic use of alprazolam. We have adjusted VNS settings as documented. Mom was encouraged to swipe magnet three times daily as well as with any aura, during and after seizure. We have swiped twice in office to ensure tolerability. He will continue Vimpat, Keppra and lamotrigine as prescribed. Labs ordered, today. Mom request to have labs drawn at PCP office tomorrow with CPE labs. He may continue omeprazole as needed. Vitamin D and  calcium recommendations discussed. Healthy lifestyle habits encouraged. He will follow up with PCP as  directed. He will return to see Dr Vickey Huger in 4 months, sooner if needed. He and his mother verbalize understanding and agreement with this plan.    No orders of the defined types were placed in this encounter.    No orders of the defined types were placed in this encounter.    Shawnie Dapper, MSN, FNP-C 12/11/2023, 12:35 PM  [facility] [address] [place], [place] [address] [phone]

## 2023-12-13 ENCOUNTER — Other Ambulatory Visit: Payer: Self-pay | Admitting: *Deleted

## 2023-12-13 ENCOUNTER — Telehealth: Payer: Self-pay

## 2023-12-13 ENCOUNTER — Ambulatory Visit (INDEPENDENT_AMBULATORY_CARE_PROVIDER_SITE_OTHER): Payer: Medicare Other | Admitting: Family Medicine

## 2023-12-13 ENCOUNTER — Encounter: Payer: Self-pay | Admitting: Family Medicine

## 2023-12-13 VITALS — BP 125/70 | HR 68

## 2023-12-13 DIAGNOSIS — Z9689 Presence of other specified functional implants: Secondary | ICD-10-CM

## 2023-12-13 DIAGNOSIS — G40209 Localization-related (focal) (partial) symptomatic epilepsy and epileptic syndromes with complex partial seizures, not intractable, without status epilepticus: Secondary | ICD-10-CM | POA: Diagnosis not present

## 2023-12-13 DIAGNOSIS — G808 Other cerebral palsy: Secondary | ICD-10-CM

## 2023-12-13 MED ORDER — VIMPAT 200 MG PO TABS: 200.0000 mg | ORAL_TABLET | Freq: Two times a day (BID) | ORAL | 1 refills | Status: DC

## 2023-12-13 MED ORDER — KEPPRA XR 750 MG PO TB24: 1500.0000 mg | ORAL_TABLET | Freq: Two times a day (BID) | ORAL | 3 refills | Status: DC

## 2023-12-13 MED ORDER — LAMICTAL XR 300 MG PO TB24: 600.0000 mg | ORAL_TABLET | Freq: Every morning | ORAL | 3 refills | Status: AC

## 2023-12-13 MED ORDER — NAYZILAM 5 MG/0.1ML NA SOLN: 5.0000 mg | NASAL | 5 refills | Status: DC | PRN

## 2023-12-13 NOTE — Telephone Encounter (Signed)
 Harry Carr pt was seen today (12/13/23)   Rx printed for some reason, I have it pending to be signed again and set on normal.

## 2023-12-13 NOTE — Telephone Encounter (Signed)
 Call to mother to review VNS, no answer. Left message to return call

## 2023-12-13 NOTE — Telephone Encounter (Signed)
 Mother returned call. We reviewed battery life and swipe indications and answered all questions about VNS.  She was appreciative of call.

## 2023-12-14 MED ORDER — NAYZILAM 5 MG/0.1ML NA SOLN: 5.0000 mg | NASAL | 5 refills | Status: DC | PRN

## 2023-12-29 ENCOUNTER — Other Ambulatory Visit: Payer: Self-pay

## 2023-12-29 DIAGNOSIS — I10 Essential (primary) hypertension: Secondary | ICD-10-CM

## 2023-12-29 MED ORDER — OMEPRAZOLE 20 MG PO CPDR: 20.0000 mg | DELAYED_RELEASE_CAPSULE | Freq: Every evening | ORAL | 0 refills | Status: DC

## 2023-12-29 MED ORDER — LOSARTAN POTASSIUM 100 MG PO TABS: 100.0000 mg | ORAL_TABLET | Freq: Every day | ORAL | 0 refills | Status: DC

## 2024-01-15 ENCOUNTER — Telehealth: Payer: Self-pay | Admitting: Family Medicine

## 2024-01-15 NOTE — Telephone Encounter (Signed)
 LVM on pt's mother Jane's phone relaying Harry Carr's message. If she calls back, please schedule pt for first available with Dr. Samara Crest for second opinion.

## 2024-01-15 NOTE — Telephone Encounter (Signed)
-----   Message from Amy Lomax sent at 01/15/2024  3:50 PM EDT ----- Could you guys reach out to Jerrol's mother and let her know that I spoke with Dr Albertina Hugger and she would love for [facility] to see Dr Samara Crest for a consult regarding seizures. Can we get him scheduled with Dr Samara Crest for a second opinion? TY! ----- Message ----- From: Neomia Banner, MD Sent: 01/12/2024   3:35 PM EDT To: Terrilyn Fick, NP  I would love a second Opinion from Dr Samara Crest.  Given Corey's history of early brain damage and cerebral palsy it's not likely a genetically based seizure syndrome.  Seizure freedom may never be achieved , but Corey's seizures were much more frequent before VNS.  CD ----- Message ----- From: Terrilyn Fick, NP Sent: 12/13/2023   4:47 PM EDT To: Neomia Banner, MD  Hey Dr Domeier! I saw Toru today and he continues to have a seizure event every 9-10 days despite ASM regimen and regular VNS swiping. I have encouraged his mom to increase number of swipes daily and was wondering if he may be a good candidate for review with Dr Samara Crest. Could Gilbert Narain Mott be a concern with this gentleman?

## 2024-01-18 ENCOUNTER — Ambulatory Visit

## 2024-01-18 NOTE — Telephone Encounter (Signed)
 Spoke with patient's mother to reschedule 12/18/24 appointment due to Dr. Jetta Morrow schedule changing. Also scheduled patient for an appointment with Dr. Samara Crest for 04/11/24 at 1:15pm and added appointment to wait list

## 2024-01-19 DIAGNOSIS — G808 Other cerebral palsy: Secondary | ICD-10-CM | POA: Diagnosis not present

## 2024-02-29 ENCOUNTER — Ambulatory Visit: Admitting: Neurology

## 2024-02-29 ENCOUNTER — Encounter: Payer: Self-pay | Admitting: Neurology

## 2024-02-29 VITALS — BP 132/84 | HR 77 | Resp 16 | Ht 70.0 in

## 2024-02-29 DIAGNOSIS — G808 Other cerebral palsy: Secondary | ICD-10-CM | POA: Diagnosis not present

## 2024-02-29 DIAGNOSIS — Z9689 Presence of other specified functional implants: Secondary | ICD-10-CM

## 2024-02-29 DIAGNOSIS — G40011 Localization-related (focal) (partial) idiopathic epilepsy and epileptic syndromes with seizures of localized onset, intractable, with status epilepticus: Secondary | ICD-10-CM

## 2024-02-29 MED ORDER — CENOBAMATE 14 X 12.5 MG & 14 X 25 MG PO TBPK: ORAL_TABLET | ORAL | 0 refills | Status: DC

## 2024-02-29 MED ORDER — CENOBAMATE 14 X 50 MG & 14 X100 MG PO TBPK: ORAL_TABLET | ORAL | 0 refills | Status: DC

## 2024-02-29 NOTE — Patient Instructions (Signed)
 Continue with levetiracetam  1500 mg twice daily Continue with Vimpat  200 mg twice daily Continue with lamotrigine  XR 600 mg daily Add cenobamate 12.5 mg daily will titrate with a goal of 100 mg daily Will obtain a routine EEG, I will contact you over the result Follow-up in 3 to 76-months or sooner if worse Please contact me if you have a seizures or any side effect from the medication.

## 2024-02-29 NOTE — Progress Notes (Signed)
 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 Pt mother denied pt missing medication. Most sz occure from 6:30pm -9p about 80% of the time. Usually has a sz per week.       INTERVAL HISTORY 02/29/2024:  Patient presents today for follow-up, he is accompanied by both parents.  His seizures are still intractable despite Keppra  1500 mg twice daily, Lamotrigine  XR 600 mg daily and Vimpat  200 mg twice daily.  Mother reports on average he has 1 seizure per week.  His seizures are described as tonic posturing, lasting less than a minute.  He also had generalized convulsion but has not had any lately.  He does have a VNS but is subtherapeutic.  They tell me that his seizures that occur in the evening, between 630 PM and 9 PM.   06/20/22 ALL:  Bluford Sedler is a 43 y.o. male here today for follow up for seizures. He returns with his mother who aids in history. He continues Vimpat  (Brand) 200mg  BID, Keppra  (Brand) 1500mg  BID and lamotrigine  300mg  TB24 QD. He is tolerating meds. Mom added alprazolam  0.25mg  at 7pm. She reports he went 9 days without a seizure after starting. She thought that seizures seemed a little less frequent over the first month but seizures have increased in frequency over the past three months. He seems to be having at least one event every day, sometimes two. She is swiping magnet at least once daily and then prior t, during and following every seizure event. She reports he will complain of  a headache then she notes head turns to one side and eyes are deviated. Event usually last a couple minutes. She uses Nayzilam  if event last longer. She averages 1-2 doses per month. Headache resolves spontaneously following event. No obvious post ictal state. He is followed closely by PCP. He has fasting labs scheduled for tomorrow. He seems to tolerate VNS. No significant sore throat or worsening cough. He continues omeprazole  as needed for GERD. He is taking MVI daily. He is a picky eater.   HISTORY (copied from Dr Dohmeier's previous note)   Darleene Baller presents for VNS interrogation and reprogramming of 4-5 settings. He was seen for a prolonged break through seizure ,which lasted beyond emergency medications. Swiping of the VNS magnet. The ED in [facility] used the term aspiration Pneumonia, but X ray has not confirmed. Gave Diazepam . Wrote for ATB , but not needed.  All seizures at 8 Pm.  His witching hour.    Today we will refill Nayzilam , as a nasal spray and I discussed use of cannabinol.  He is treated for acid reflux.   VNS : We will change the settings_ up on 1.25 mA on VNS,  auto stim to 1.375 mA, Magnet to 1.5 mA. Magnet swipe increase to tid. Pulse width  250 microseconds. On time 30. Off time 3 minutes.    Xanax  as a resume medication, 0.25 mg at 7 Pm to see if this prevents seizures    OTHER MEDICAL CONDITIONS: Epilepsy, cerebral palsy, hypertension  REVIEW OF SYSTEMS:  Full 14 system review of systems performed and negative with exception of: As noted in the HPI   ALLERGIES: No Known Allergies  HOME MEDICATIONS: Outpatient Medications Prior to Visit  Medication Sig Dispense Refill   aspirin 81 MG tablet Take 81 mg by mouth daily.     KEPPRA  XR 750 MG 24 hr tablet Take 2 tablets (1,500 mg total) by mouth in the morning and at bedtime. 360 tablet 3   LAMICTAL  XR 300 MG TB24 24 hour tablet Take 2 tablets (600 mg total) by mouth in the morning. 180 tablet 3   losartan   (COZAAR ) 100 MG tablet Take 1 tablet (100 mg total) by mouth daily. for blood pressure 90 tablet 0   Midazolam  (NAYZILAM ) 5 MG/0.1ML SOLN Place 5 mg into the nose as needed (Administer nasal spray at seizure onset. may use an additional nasal spray if the first is ineffective after 10 minutes. Do not exceed more than 2 sprays per seizure episode). 5 each 5   Multiple Vitamins-Minerals (MULTIVITAMIN WITH MINERALS) tablet Take 1 tablet by mouth daily.     omeprazole  (PRILOSEC) 20 MG capsule Take 1 capsule (20 mg total) by mouth every evening. 90 capsule 0   VIMPAT  200 MG TABS tablet Take 1 tablet (200 mg total) by mouth 2 (two) times daily. 180 tablet 1   vitamin E 45 MG (100 UNITS) capsule Take 500 Units by mouth daily.     cephALEXin  (KEFLEX ) 500 MG capsule Take 1 capsule (500 mg total) by mouth 2 (two) times daily. (Patient not taking: Reported on 12/13/2023) 20 capsule 0   No facility-administered medications prior to visit.    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)     PAST SURGICAL HISTORY: Past Surgical History:  Procedure Laterality Date   dorsal rhizotomy  age 24 yrs   VAGUS NERVE STIMULATOR INSERTION Left 07/14/2020   Procedure: VAGAL NERVE STIMULATOR IMPLANT;  Surgeon: Lanis Pupa, MD;  Location: [facility] OR;  Service: Neurosurgery;  Laterality: Left;  anterior   WISDOM TOOTH EXTRACTION  03/2010    FAMILY HISTORY: Family History  Problem Relation Age of Onset   Heart attack Father    Heart disease Maternal Grandmother    Cancer - Other Maternal Grandfather        lukemia    SOCIAL HISTORY: Social History   Socioeconomic History   Marital status: Single    Spouse name: Not on file   Number of children: 0   Years of education: Not on  file   Highest education level: Not on file  Occupational History   Not on file  Tobacco Use   Smoking status: Never   Smokeless tobacco: Never  Substance and Sexual Activity   Alcohol use: Never   Drug use: No   Sexual activity: Not Currently  Other Topics Concern   Not on file  Social History Narrative   Right handed. Caffeine  none.  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   Social Drivers of Corporate investment banker Strain: Low Risk  (03/25/2022)   Overall Financial Resource Strain (CARDIA)    Difficulty of Paying Living Expenses: Not hard at all  Food Insecurity: No Food  Insecurity (03/25/2022)   Hunger Vital Sign    Worried About Running Out of Food in the Last Year: Never true    Ran Out of Food in the Last Year: Never true  Transportation Needs: No Transportation Needs (03/25/2022)   PRAPARE - Administrator, Civil Service (Medical): No    Lack of Transportation (Non-Medical): No  Physical Activity: Not on file  Stress: No Stress Concern Present (03/25/2022)   [employer] of Occupational Health - Occupational Stress Questionnaire    Feeling of Stress : Not at all  Social Connections: Moderately Isolated (03/25/2022)   Social Connection and Isolation Panel    Frequency of Communication with Friends and Family: More than three times a week    Frequency of Social Gatherings with Friends and Family: More than three times a week    Attends Religious Services: More than 4 times per year    Active Member of [employer] or Organizations: No    Attends Banker Meetings: Never    Marital Status: Never married  Intimate Partner Violence: Not At Risk (03/25/2022)   Humiliation, Afraid, Rape, and Kick questionnaire    Fear of Current or Ex-Partner: No    Emotionally Abused: No    Physically Abused: No    Sexually Abused: No    PHYSICAL EXAM  GENERAL EXAM/CONSTITUTIONAL: Vitals:  Vitals:   02/29/24 1357  BP: 132/84  Pulse: 77  Resp:  16  SpO2: 96%  Height: 5' 10 (1.778 m)   Body mass index is 23.68 kg/m. Wt Readings from Last 3 Encounters:  02/01/23 165 lb (74.8 kg)  01/26/23 160 lb (72.6 kg)  06/21/22 164 lb 8 oz (74.6 kg)   Patient is in no distress; well developed, nourished and groomed; neck is supple  MUSCULOSKELETAL: Gait, strength, tone, movements noted in Neurologic exam below  NEUROLOGIC: MENTAL STATUS:      No data to display         awake, alert, oriented to person, place and time recent and remote memory intact normal attention and concentration language fluent, comprehension intact, naming intact fund of knowledge appropriate Spastic quadriparesis  Difficulty with fine movement  Gait not tested      DIAGNOSTIC DATA (LABS, IMAGING, TESTING) - I reviewed patient records, labs, notes, testing and imaging myself where available.  Lab Results  Component Value Date   WBC 4.4 01/26/2023   HGB 14.9 01/26/2023   HCT 43.5 01/26/2023   MCV 91 01/26/2023   PLT 243 01/26/2023      Component Value Date/Time   NA 143 01/26/2023 1140   K 4.3 01/26/2023 1140   CL 104 01/26/2023 1140   CO2 24 01/26/2023 1140   GLUCOSE 79 01/26/2023 1140   GLUCOSE 99 07/14/2020 0648   BUN 7 01/26/2023 1140   CREATININE 0.94 01/26/2023 1140   CALCIUM 9.5 01/26/2023 1140   PROT 6.6 01/26/2023 1140   ALBUMIN 4.5 01/26/2023 1140   AST 25 01/26/2023 1140   ALT 17 01/26/2023 1140   ALKPHOS 129 (H) 01/26/2023 1140   BILITOT 0.7 01/26/2023 1140   GFRNONAA 109 08/19/2020 1019   GFRNONAA >60 07/14/2020 0648   GFRAA 126 08/19/2020 1019   Lab Results  Component Value Date   CHOL 194 01/26/2023   HDL 43 01/26/2023   LDLCALC 119 (H) 01/26/2023   TRIG 182 (H) 01/26/2023   No results found for: HGBA1C No results found for: VITAMINB12 No results found for: TSH  EEG 2021 Lorean  waves, left more than right frontotemporal region    ASSESSMENT AND PLAN  43 y.o. year old male  with history of  cerebral palsy, intractable epilepsy status post VNS placement in 2021, hypertension, who is presenting for further management of his intractable epilepsy.  He continues to have seizures on average once per week.  He is compliant with his medications including levetiracetam  1500 mg twice daily, lacosamide  200 mg twice daily and Lamictal  XR 600 mg daily.  He also has a VNS, will adjust VNS to the therapeutic range, and also add cenobamate.  Plan will be to start at 12.5 mg with a goal of 100 mg.  We discussed potential side effect including dizziness, tremors and they understand to contact me if patient does have the side effect, at that time we will likely decrease the Vimpat .  I will see him in 3 to 11-months for follow-up or sooner if worse.   1. Partial idiopathic epilepsy with seizures of localized onset, intractable, with status epilepticus (HCC)   2. Status post placement of VNS (vagus nerve stimulation) device   3. Other cerebral palsy (HCC)     There are no Patient Instructions on file for this visit.   Per [place] statutes, patients with seizures are not allowed to drive until they have been seizure-free for six months.  Other recommendations include using caution when using heavy equipment or power tools. Avoid working on ladders or at heights. Take showers instead of baths.  Do not swim alone.  Ensure the water temperature is not too high on the home water heater. Do not go swimming alone. Do not lock yourself in a room alone (i.e. bathroom). When caring for infants or small children, sit down when holding, feeding, or changing them to minimize risk of injury to the child in the event you have a seizure. Maintain good sleep hygiene. Avoid alcohol.  Also recommend adequate sleep, hydration, good diet and minimize stress.   During the Seizure  - First, ensure adequate ventilation and place patients on the floor on their left side  Loosen clothing around the neck and ensure the  airway is patent. If the patient is clenching the teeth, do not force the mouth open with any object as this can cause severe damage - Remove all items from the surrounding that can be hazardous. The patient may be oblivious to what's happening and may not even know what he or she is doing. If the patient is confused and wandering, either gently guide him/her away and block access to outside areas - Reassure the individual and be comforting - Call 911. In most cases, the seizure ends before EMS arrives. However, there are cases when seizures may last over 3 to 5 minutes. Or the individual may have developed breathing difficulties or severe injuries. If a pregnant patient or a person with diabetes develops a seizure, it is prudent to call an ambulance. - Finally, if the patient does not regain full consciousness, then call EMS. Most patients will remain confused for about 45 to 90 minutes after a seizure, so you must use judgment in calling for help. - Avoid restraints but make sure the patient is in a bed with padded side rails - Place the individual in a lateral position with the neck slightly flexed; this will help the saliva drain from the mouth and prevent the tongue from falling backward - Remove all nearby furniture and other hazards from the area - Provide verbal assurance as the  individual is regaining consciousness - Provide the patient with privacy if possible - Call for help and start treatment as ordered by the caregiver   After the Seizure (Postictal Stage)  After a seizure, most patients experience confusion, fatigue, muscle pain and/or a headache. Thus, one should permit the individual to sleep. For the next few days, reassurance is essential. Being calm and helping reorient the person is also of importance.  Most seizures are painless and end spontaneously. Seizures are not harmful to others but can lead to complications such as stress on the lungs, brain and the heart. Individuals  with prior lung problems may develop labored breathing and respiratory distress.    Discussed Patients with epilepsy have a small risk of sudden unexpected death, a condition referred to as sudden unexpected death in epilepsy (SUDEP). SUDEP is defined specifically as the sudden, unexpected, witnessed or unwitnessed, nontraumatic and nondrowning death in patients with epilepsy with or without evidence for a seizure, and excluding documented status epilepticus, in which post mortem examination does not reveal a structural or toxicologic cause for death     Orders Placed This Encounter  Procedures   EEG adult    Meds ordered this encounter  Medications   Cenobamate 14 x 12.5 MG & 14 x 25 MG TBPK    Sig: Take 12.5 mg daily for 14 days then increase to 25 mg for 14 days    Dispense:  28 tablet    Refill:  0   Cenobamate 14 x 50 MG & 14 x100 MG TBPK    Sig: Take 50 mg by mouth daily at 12 noon for 14 days, THEN 100 mg daily at 12 noon for 14 days.    Dispense:  28 each    Refill:  0    Please dispense on or after July 17. Thanks    Return in about 4 months (around 06/30/2024).  The patient's condition of epilepsy requires frequent monitoring and adjustments in the treatment plan, reflecting the ongoing complexity of care.  This provider is the continuing focal point for all needed services for this condition.   Pastor Falling, MD 02/29/2024, 5:57 PM  [facility] [address] [place], [place] [address] [phone]

## 2024-03-01 ENCOUNTER — Telehealth: Payer: Self-pay | Admitting: Neurology

## 2024-03-01 NOTE — Telephone Encounter (Signed)
 Please try Tylenol  or Ibuprofen. Please ask them not to swipe the VNS all weekend. I will check on them before noon to see if symptoms are better or not.

## 2024-03-01 NOTE — Telephone Encounter (Signed)
 Called mother back at [phone]. VNS settings increased yesterday at visit with Dr. Gregg. Sx started this morning when he got up around 7am. No fever. Headache pain located in back of head just above ears, radiating towards right side. Describes as throbbing, intermittent pain. Took meds but has not had anything to eat/drink yet. I encouraged for him to try to eat/drink something and I will speak with Dr. Gregg and we will call back with recommendation.  No changes to oral meds yet. They have not picked up meds from pharmacy yet.

## 2024-03-01 NOTE — Telephone Encounter (Signed)
 Called mother back. Relayed Dr. Janean recommendation. She already gave him Tylenol  325mg  at 8am and this helped sx. She is wondering if he has seizure, should they swipe VNS? Aware she can address with Dr. Gregg when he calls to check on things.

## 2024-03-01 NOTE — Telephone Encounter (Signed)
 Spoke with mother, headaches got better after Tylenol . Advised them to swipe VNS in the case of seizure

## 2024-03-01 NOTE — Telephone Encounter (Signed)
 Pt mother called in regards to PT has an implant and yesterday  the implant doctor increase the medication for PT . Now Pt is having really bad Headaches and it sweating really bad . PT mother is concern and doesn't know what could be the cause of these symptoms

## 2024-03-12 ENCOUNTER — Telehealth: Payer: Self-pay | Admitting: *Deleted

## 2024-03-12 ENCOUNTER — Ambulatory Visit (INDEPENDENT_AMBULATORY_CARE_PROVIDER_SITE_OTHER): Admitting: Neurology

## 2024-03-12 DIAGNOSIS — Z9689 Presence of other specified functional implants: Secondary | ICD-10-CM

## 2024-03-12 DIAGNOSIS — G40011 Localization-related (focal) (partial) idiopathic epilepsy and epileptic syndromes with seizures of localized onset, intractable, with status epilepticus: Secondary | ICD-10-CM | POA: Diagnosis not present

## 2024-03-12 DIAGNOSIS — G808 Other cerebral palsy: Secondary | ICD-10-CM

## 2024-03-12 NOTE — Telephone Encounter (Signed)
 Called and left message for om to return call.

## 2024-03-12 NOTE — Telephone Encounter (Signed)
 Called and left msg to call back 1st attempt by hf 03/12/24

## 2024-03-12 NOTE — Telephone Encounter (Signed)
 Call to pharmacy, spoke with Katrina, she states they have the cenobamate  scripts and will order and fill.

## 2024-03-13 ENCOUNTER — Ambulatory Visit: Payer: Self-pay | Admitting: Neurology

## 2024-03-13 NOTE — Procedures (Signed)
    History:  43 year old man with intractable epilepsy   EEG classification: Awake and drowsy  Duration: 25 minutes   Technical aspects: This EEG study was done with scalp electrodes positioned according to the 10-20 International system of electrode placement. Electrical activity was reviewed with band pass filter of 1-70Hz , sensitivity of 7 uV/mm, display speed of 30mm/sec with a 60Hz  notched filter applied as appropriate. EEG data were recorded continuously and digitally stored.   Description of the recording: The background rhythms of this recording consists of a fairly well modulated medium amplitude theta rhythm. Photic stimulation was not performed, did not show any abnormalities. Hyperventilation was also not performed. Drowsiness was not seen. No abnormal epileptiform discharges seen during this recording. There was no focal slowing but there was diffuse slowing. There were no electrographic seizure identified.   Abnormality: Mild diffuse slowing  Impression: This is an abnormal awake EEG due to presence of diffuse slowing. This is consistent with a generalized brain dysfunction, nonspecific etiology.     Zellie Jenning, MD [facility]

## 2024-03-20 ENCOUNTER — Telehealth: Payer: Self-pay | Admitting: Neurology

## 2024-03-20 NOTE — Telephone Encounter (Signed)
 Pt's mother called stating that the pt is about done with the Xcopri  (Cenobamate  Tab) 25mg  and they are wanting to know if the 50mg  will be called in for him that he is to start after the 25mg . She stated they would like a call back when it is called in to the [facility]

## 2024-03-20 NOTE — Telephone Encounter (Signed)
 Call to pharmacy, they have script and will fill tomorrow. They will call mom and make her aware

## 2024-03-21 ENCOUNTER — Other Ambulatory Visit: Payer: Self-pay

## 2024-03-21 DIAGNOSIS — I10 Essential (primary) hypertension: Secondary | ICD-10-CM

## 2024-03-21 MED ORDER — LOSARTAN POTASSIUM 100 MG PO TABS: 100.0000 mg | ORAL_TABLET | Freq: Every day | ORAL | 0 refills | Status: DC

## 2024-03-21 MED ORDER — OMEPRAZOLE 20 MG PO CPDR: 20.0000 mg | DELAYED_RELEASE_CAPSULE | Freq: Every evening | ORAL | 0 refills | Status: DC

## 2024-04-11 ENCOUNTER — Institutional Professional Consult (permissible substitution): Admitting: Neurology

## 2024-04-12 ENCOUNTER — Telehealth: Payer: Self-pay | Admitting: Neurology

## 2024-04-12 ENCOUNTER — Encounter: Payer: Self-pay | Admitting: Neurology

## 2024-04-12 NOTE — Telephone Encounter (Signed)
 Pt mother called in regards to PT Medication (Cenobamate  14 x 50 MG & 14 x100 MG TBPK ), Pt  mother is concern because Pt will be out of medication soon and not sure what will be next steps for PT . Medication was given to Pt as a trail  run.

## 2024-04-12 NOTE — Telephone Encounter (Signed)
 error

## 2024-04-15 MED ORDER — CENOBAMATE 100 MG PO TABS
1.0000 | ORAL_TABLET | Freq: Every day | ORAL | 5 refills | Status: DC
Start: 2024-04-15 — End: 2024-04-17

## 2024-04-15 NOTE — Telephone Encounter (Signed)
 Done. Thanks.

## 2024-04-15 NOTE — Telephone Encounter (Signed)
 Medication needs to be confirmed before sending in. Was on a titration pack. May just need regular dosing now.

## 2024-04-15 NOTE — Telephone Encounter (Signed)
 Pt's mother states [facility]

## 2024-04-15 NOTE — Addendum Note (Signed)
 Addended byBETHA GREGG LEK on: 04/15/2024 10:17 AM   Modules accepted: Orders

## 2024-04-15 NOTE — Addendum Note (Signed)
 Addended by: JOSHUA IZETTA CROME on: 04/15/2024 08:45 AM   Modules accepted: Orders

## 2024-04-17 ENCOUNTER — Telehealth: Payer: Self-pay | Admitting: Neurology

## 2024-04-17 ENCOUNTER — Other Ambulatory Visit: Payer: Self-pay | Admitting: Neurology

## 2024-04-17 MED ORDER — KEPPRA XR 750 MG PO TB24: 2250.0000 mg | ORAL_TABLET | Freq: Two times a day (BID) | ORAL | 3 refills | Status: DC

## 2024-04-17 MED ORDER — CENOBAMATE 150 MG PO TABS: 150.0000 mg | ORAL_TABLET | Freq: Every day | ORAL | 5 refills | Status: DC

## 2024-04-17 NOTE — Telephone Encounter (Signed)
 Mom returned call from Monday and verified that Harry Carr is taking the cenobamate  100 mg tablets. I informed her that a new script was sent in as they are finishing up the titration pack. She stated that when she had called on the 8th she reported to phone staff that patient had had 10 seizures in July and 4 in August. I apologized that I was not relayed that information. She reported that they were GTC and 2 times he felt like he was going to have a sz but never did. She did use the nasal spray 1 time. They are swiping the VNS as well. I also advised her to send a mychart message and inform us  when he has seizures as well mom in agreement and will also follow up with pharmacy and make sure script was received.

## 2024-04-17 NOTE — Telephone Encounter (Signed)
 [facility]) KEPPRA  XR 750 MG 24 hr tablet requires a prior authorization. Have faxed over paperwork today.

## 2024-04-17 NOTE — Telephone Encounter (Signed)
 Please have them increase the Keppra  XR 750 to 3 tablets twice a day. We will also increase the Cenobamate  to 150 mg daily. A new prescription was sent to the pharmacy. Thanks

## 2024-04-17 NOTE — Telephone Encounter (Signed)
 Call to patient mom, no answer left detailed message on machine with med changes. Also sent mychart message with med changes.  Call to pharmacy, spoke with Angelynn and reconciled medication list.

## 2024-04-17 NOTE — Telephone Encounter (Signed)
 Please help with PA request, thank you. Route updates to P [facility]-pod 2 calls

## 2024-04-18 ENCOUNTER — Other Ambulatory Visit (HOSPITAL_COMMUNITY): Payer: Self-pay

## 2024-04-18 NOTE — Telephone Encounter (Signed)
 Pharmacy Patient Advocate Encounter   Received notification from Physician's Office that prior authorization for Keppra  XR 750mg  24HR Tablet is required/requested.   Insurance verification completed.   The patient is insured through [facility] .   Per test claim: PA required; PA submitted to above mentioned insurance via Latent Key/confirmation #/EOC [id number] Status is pending  AWAITING CLINICAL QUESTIONS TO POPULATE

## 2024-04-19 ENCOUNTER — Other Ambulatory Visit (HOSPITAL_COMMUNITY): Payer: Self-pay

## 2024-04-19 NOTE — Telephone Encounter (Addendum)
     PA not needed at this time per plan, Per test claim DUR codes will need to be put in at the pharmacy level where pt is picking up. They will get the same rejection with the same information for them to enter into their system. The DUR codes are flagging due to PT being on other CNS medication as a safety precaution.

## 2024-04-22 NOTE — Telephone Encounter (Signed)
 Call to pharmacy, spoke to angelyn, both keppra  and xcopri  went throw insurance, no other action needed.

## 2024-05-03 ENCOUNTER — Other Ambulatory Visit: Payer: Self-pay | Admitting: Family Medicine

## 2024-05-03 DIAGNOSIS — I10 Essential (primary) hypertension: Secondary | ICD-10-CM

## 2024-05-03 NOTE — Telephone Encounter (Signed)
 Copied from CRM #[id number]. Topic: Clinical - Medication Refill >> May 03, 2024  1:00 PM Delon T wrote: Medication: losartan  (COZAAR ) 100 MG tablet omeprazole  (PRILOSEC) 20 MG capsule  Has the patient contacted their pharmacy? Yes (Agent: If no, request that the patient contact the pharmacy for the refill. If patient does not wish to contact the pharmacy document the reason why and proceed with request.) (Agent: If yes, when and what did the pharmacy advise?)  This is the patient's preferred pharmacy:  [facility] - [place], [place] - [place], [place] - [address] [place] [address] Phone: [phone] Fax: [phone]   Is this the correct pharmacy for this prescription? Yes If no, delete pharmacy and type the correct one.   Has the prescription been filled recently? Yes  Is the patient out of the medication? Yes  Has the patient been seen for an appointment in the last year OR does the patient have an upcoming appointment? Yes  Can we respond through MyChart? Yes  Agent: Please be advised that Rx refills may take up to 3 business days. We ask that you follow-up with your pharmacy.

## 2024-05-07 MED ORDER — LOSARTAN POTASSIUM 100 MG PO TABS: 100.0000 mg | ORAL_TABLET | Freq: Every day | ORAL | 0 refills | Status: DC

## 2024-05-07 MED ORDER — OMEPRAZOLE 20 MG PO CPDR: 20.0000 mg | DELAYED_RELEASE_CAPSULE | Freq: Every evening | ORAL | 0 refills | Status: DC

## 2024-05-20 ENCOUNTER — Encounter: Payer: Self-pay | Admitting: Family Medicine

## 2024-05-20 ENCOUNTER — Ambulatory Visit (INDEPENDENT_AMBULATORY_CARE_PROVIDER_SITE_OTHER): Admitting: Family Medicine

## 2024-05-20 VITALS — BP 136/68 | HR 69 | Temp 98.2°F | Ht 70.0 in

## 2024-05-20 DIAGNOSIS — G800 Spastic quadriplegic cerebral palsy: Secondary | ICD-10-CM | POA: Diagnosis not present

## 2024-05-20 DIAGNOSIS — G40309 Generalized idiopathic epilepsy and epileptic syndromes, not intractable, without status epilepticus: Secondary | ICD-10-CM | POA: Diagnosis not present

## 2024-05-20 DIAGNOSIS — I10 Essential (primary) hypertension: Secondary | ICD-10-CM

## 2024-05-20 DIAGNOSIS — E782 Mixed hyperlipidemia: Secondary | ICD-10-CM | POA: Diagnosis not present

## 2024-05-20 DIAGNOSIS — L600 Ingrowing nail: Secondary | ICD-10-CM | POA: Diagnosis not present

## 2024-05-20 DIAGNOSIS — K219 Gastro-esophageal reflux disease without esophagitis: Secondary | ICD-10-CM | POA: Diagnosis not present

## 2024-05-20 LAB — POCT LIPID PANEL
HDL: 39
LDL: 102
Non-HDL: 120
TC: 159
TRG: 93

## 2024-05-20 MED ORDER — LOSARTAN POTASSIUM 100 MG PO TABS: 100.0000 mg | ORAL_TABLET | Freq: Every day | ORAL | 3 refills | Status: AC

## 2024-05-20 MED ORDER — OMEPRAZOLE 20 MG PO CPDR: 20.0000 mg | DELAYED_RELEASE_CAPSULE | Freq: Two times a day (BID) | ORAL | 3 refills | Status: AC

## 2024-05-20 NOTE — Progress Notes (Signed)
 Subjective:  Patient ID: Harry Carr, male    DOB: 1981/08/25  Age: 43 y.o. MRN: [id number]  Chief Complaint  Patient presents with   Medical Management of Chronic Issues    Discussed the use of AI scribe software for clinical note transcription with the patient, who gave verbal consent to proceed.  History of Present Illness   Harry Carr is a 43 year old male with cerebral palsy and seizures who presents for medication refills and follow-up. He is accompanied by his caregiver, who is likely a family member.  Seizure activity - Seizures began at age 47 or [age] years. - Seizures occur approximately every six days despite polytherapy. - Episodes do not involve full-body shaking or falling. - During seizures, he becomes stiff and requires support to prevent falls. - Current antiepileptic regimen includes Lamictal  XR 300 mg twice daily, Keppra  XR 750 mg twice daily, Vimpat  200 mg twice daily, and Xcopri  (cenobamate ) 100 mg once daily (to be increased to 150 mg next week). - Midazolam  nasal spray is available for emergency seizure management.  Cerebral palsy and functional status - Cerebral palsy with significant motor impairment. - Requires assistance with transfers and cannot stand independently.  Gastroesophageal reflux symptoms - Acid reflux symptoms worsened following an implant procedure. - Nighttime coughing episodes previously present, now resolved with omeprazole  twice daily. - Improved sleep since initiation of acid reflux medication.  Hyperlipidemia and dietary limitations - Previous LDL 119 mg/dL and triglycerides 817 mg/dL. - Diet limited by texture preferences and gagging with certain foods. - Diet primarily consists of applesauce, bananas, shrimp, hot dogs, and pizza. - Difficulty tolerating other vegetables and foods due to gagging.  Hypertension and cardiovascular risk reduction - On losartan  100 mg once daily for blood pressure management. - Aspirin 81 mg  once daily for cardiovascular risk reduction. - Also takes a multivitamin and vitamin E.  Recurrent ingrown toenail - Recurring ingrown toenail on right foot causing discomfort. - Persistent despite home management attempts.  Constitutional and systemic symptoms - No fevers, chills, sweats, earaches, sore throat, stuffy nose, chest pain, breathing problems, abdominal pain, constipation, nausea, vomiting, or urinary issues.          05/20/2024   11:02 AM 01/26/2023   11:03 AM 03/25/2022    9:06 AM 08/19/2020    9:48 AM 07/01/2014    3:22 PM  Depression screen PHQ 2/9  Decreased Interest 0 0 0 0 0  Down, Depressed, Hopeless 0 0 0 0 0  PHQ - 2 Score 0 0 0 0 0  Altered sleeping 0 0     Tired, decreased energy 0 0     Change in appetite 0 0     Feeling bad or failure about yourself  0 0     Trouble concentrating 0 0     Moving slowly or fidgety/restless 0 0     Suicidal thoughts 0 0     PHQ-9 Score 0 0     Difficult doing work/chores Not difficult at all Not difficult at all           05/20/2024   11:02 AM  Fall Risk   Falls in the past year? 0  Number falls in past yr: 0  Injury with Fall? 0  Risk for fall due to : Impaired mobility  Follow up Falls evaluation completed    Patient Care Team: Sherre Clapper, MD as PCP - General (Family Medicine) Dohmeier, Dedra, MD as Consulting Physician (Neurology)  Review of Systems  Constitutional:  Negative for chills, fatigue and fever.  HENT:  Negative for congestion, ear pain and sore throat.   Respiratory:  Negative for cough and shortness of breath.   Cardiovascular:  Negative for chest pain.  Gastrointestinal:  Negative for abdominal pain, constipation, diarrhea, nausea and vomiting.  Endocrine: Negative for polydipsia, polyphagia and polyuria.  Genitourinary:  Negative for dysuria and frequency.  Musculoskeletal:  Negative for arthralgias and myalgias.  Skin:        INGROWN TOENAIL RIGHT GREAT TOENAIL  Neurological:   Positive for seizures. Negative for dizziness and headaches.  Psychiatric/Behavioral:  Negative for dysphoric mood.        No dysphoria    Current Outpatient Medications on File Prior to Visit  Medication Sig Dispense Refill   XCOPRI  100 MG TABS Take 1 tablet by mouth daily.     aspirin 81 MG tablet Take 81 mg by mouth daily.     Cenobamate  150 MG TABS Take 1 tablet (150 mg total) by mouth daily. 30 tablet 5   KEPPRA  XR 750 MG 24 hr tablet Take 3 tablets (2,250 mg total) by mouth in the morning and at bedtime. 540 tablet 3   LAMICTAL  XR 300 MG TB24 24 hour tablet Take 2 tablets (600 mg total) by mouth in the morning. 180 tablet 3   Midazolam  (NAYZILAM ) 5 MG/0.1ML SOLN Place 5 mg into the nose as needed (Administer nasal spray at seizure onset. may use an additional nasal spray if the first is ineffective after 10 minutes. Do not exceed more than 2 sprays per seizure episode). 5 each 5   Multiple Vitamins-Minerals (MULTIVITAMIN WITH MINERALS) tablet Take 1 tablet by mouth daily.     VIMPAT  200 MG TABS tablet Take 1 tablet (200 mg total) by mouth 2 (two) times daily. 180 tablet 1   vitamin E 45 MG (100 UNITS) capsule Take 500 Units by mouth daily.     No current facility-administered medications on file prior to visit.   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 [facility])    Past Surgical History:  Procedure Laterality Date   dorsal rhizotomy  age 75 yrs   VAGUS NERVE STIMULATOR INSERTION Left 07/14/2020   Procedure: VAGAL NERVE STIMULATOR IMPLANT;  Surgeon: Lanis Pupa, MD;  Location: [facility] OR;  Service: Neurosurgery;  Laterality: Left;  anterior   WISDOM TOOTH EXTRACTION  03/2010    Family History  Problem Relation Age of Onset   Heart attack Father    Heart disease Maternal  Grandmother    Cancer - Other Maternal Grandfather        lukemia   Social History   Socioeconomic History   Marital status: Single    Spouse name: Not on file   Number of children: 0   Years of education: Not on file   Highest education level: Not on file  Occupational History   Not on file  Tobacco Use   Smoking status: Never   Smokeless tobacco: Never  Substance and Sexual Activity   Alcohol use: Never   Drug use: No   Sexual activity: Not Currently  Other Topics Concern   Not on file  Social History Narrative   Right handed.  Caffeine  none.  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   Social Drivers of Corporate investment banker Strain: Low Risk  (03/25/2022)   Overall Financial Resource Strain (CARDIA)    Difficulty of Paying Living Expenses: Not hard at all  Food Insecurity: No Food Insecurity (03/25/2022)   Hunger Vital Sign    Worried About Running Out of Food in the Last Year: Never true    Ran Out of Food in the Last Year: Never true  Transportation Needs: No Transportation Needs (03/25/2022)   PRAPARE - Administrator, Civil Service (Medical): No    Lack of Transportation (Non-Medical): No  Physical Activity: Not on file  Stress: No Stress Concern Present (03/25/2022)   [employer] of Occupational Health - Occupational Stress Questionnaire    Feeling of Stress : Not at all  Social Connections: Moderately Isolated (03/25/2022)   Social Connection and Isolation Panel    Frequency of Communication with Friends and Family: More than three times a week    Frequency of Social Gatherings with Friends and Family: More than three times a week    Attends Religious Services: More than 4 times per year    Active Member of Clubs or Organizations: No    Attends Banker Meetings: Never    Marital Status: Never married    Objective:  BP 136/68   Pulse 69   Temp 98.2 F (36.8 C)   Ht 5' 10 (1.778 m)   SpO2 99%   BMI 23.68  kg/m      05/20/2024   10:57 AM 02/29/2024    1:57 PM 12/13/2023   12:50 PM  BP/Weight  Systolic BP 136 132 125  Diastolic BP 68 84 70    Physical Exam Vitals reviewed.  Constitutional:      Appearance: Normal appearance.  Neck:     Vascular: No carotid bruit.  Cardiovascular:     Rate and Rhythm: Normal rate and regular rhythm.     Heart sounds: Normal heart sounds.  Pulmonary:     Effort: Pulmonary effort is normal.     Breath sounds: Normal breath sounds. No wheezing, rhonchi or rales.  Abdominal:     General: Bowel sounds are normal.     Palpations: Abdomen is soft.     Tenderness: There is no abdominal tenderness.  Musculoskeletal:     Comments: All extremities spastic.  Speech is abnormal but understandable.   Skin:    Comments: Right great toenail ingrown, tender medially.   Neurological:     Mental Status: He is alert and oriented to person, place, and time.  Psychiatric:        Mood and Affect: Mood normal.        Behavior: Behavior normal.         Lab Results  Component Value Date   WBC 4.4 01/26/2023   HGB 14.9 01/26/2023   HCT 43.5 01/26/2023   PLT 243 01/26/2023   GLUCOSE 79 01/26/2023   CHOL 194 01/26/2023   TRIG 182 (H) 01/26/2023   HDL 43 01/26/2023   LDLCALC 119 (H) 01/26/2023   ALT 17 01/26/2023   AST 25 01/26/2023   NA 143 01/26/2023   K 4.3 01/26/2023   CL 104 01/26/2023   CREATININE 0.94 01/26/2023   BUN 7 01/26/2023   CO2 24 01/26/2023   INR 1.0 06/08/2020        Component Ref Range & Units (hover) 11:17 (05/20/24) 1  yr ago (01/26/23) 1 yr ago (06/21/22)  TC 159    HDL 39 43 R 41 R  TRG 93    LDL 102    Non-HDL 120         Assessment & Plan:  Generalized convulsive epilepsy (HCC)  Essential hypertension, benign -     Losartan  Potassium; Take 1 tablet (100 mg total) by mouth daily. for blood pressure  Dispense: 90 tablet; Refill: 3 -     POCT Lipid Panel  Gastroesophageal reflux disease without esophagitis -      Omeprazole ; Take 1 capsule (20 mg total) by mouth 2 (two) times daily before a meal.  Dispense: 180 capsule; Refill: 3  Spastic quadriplegic cerebral palsy (HCC)  Mixed hyperlipidemia  Ingrown right big toenail -     Ambulatory referral to Podiatry     Body mass index is 23.68 kg/m.   Assessment and Plan    Epilepsy Seizures managed with antiepileptic medications under neurologist Dr. Gregg. - Continue Lamictal  XR 300 mg twice daily, Keppra  XR 750 mg twice daily, Vimpat  200 mg twice daily, Xcopri  100 mg once daily, increase to 150 mg next week. - Prescribed midazolam  nasal spray for emergency seizures. - Follow-up with Dr. Gregg on October 31st  - Bring order for las in November from Dr. Gregg.  Gastroesophageal reflux disease (GERD) GERD causing nocturnal coughing, possibly exacerbated by pressure from an implant. Increased omeprazole  20 mg twice daily has helped. Refill sent.   Essential hypertension Blood pressure well-controlled with current medication regimen. - Continue losartan  100 mg daily.  - Continue aspirin 81 mg once daily.  Spastic quadriplegia - Caretakers are parents.  - communicates well  Hyperlipidemia Dietary changes improved cholesterol levels, now within normal range.  Ingrown toenail, right foot Long-standing ingrown toenail causing discomfort. - Refer to podiatrist for evaluation and potential treatment.     Meds ordered this encounter  Medications   losartan  (COZAAR ) 100 MG tablet    Sig: Take 1 tablet (100 mg total) by mouth daily. for blood pressure    Dispense:  90 tablet    Refill:  3   omeprazole  (PRILOSEC) 20 MG capsule    Sig: Take 1 capsule (20 mg total) by mouth 2 (two) times daily before a meal.    Dispense:  180 capsule    Refill:  3    Orders Placed This Encounter  Procedures   Ambulatory referral to Podiatry   POCT Lipid Panel       Follow-up: Return in about 7 weeks (around 07/08/2024) for awv, cpe with me.  .  An After Visit Summary was printed and given to the patient.  Abigail Free, MD [facility] [phone]

## 2024-05-20 NOTE — Patient Instructions (Signed)
 REFERRING TO [facility] FOR INGROWN TOENAIL.  If I have ordered a referral, lab work, or a test, please watch for messages/letters in your [place]. Please be aware of unknown numbers, as this may be a specialist's office attempting to call and schedule your appointment. You may wish to enter the specialist's phone number in your contacts, so your phone will not block the calls as SPAM. If you have NOT been contacted with in 1 week: Please call the specialist's office  Call [facility].

## 2024-05-22 ENCOUNTER — Other Ambulatory Visit: Payer: Self-pay | Admitting: Neurology

## 2024-05-22 ENCOUNTER — Telehealth: Payer: Self-pay | Admitting: Neurology

## 2024-05-22 DIAGNOSIS — G40309 Generalized idiopathic epilepsy and epileptic syndromes, not intractable, without status epilepticus: Secondary | ICD-10-CM

## 2024-05-22 MED ORDER — XCOPRI 100 MG PO TABS: 1.0000 | ORAL_TABLET | Freq: Every day | ORAL | 3 refills | Status: DC

## 2024-05-22 NOTE — Telephone Encounter (Signed)
 Pt's mother is asking for a call to discuss the current dose of the  XCOPRI  100 MG TABS , pt's mother states within recent days she has noticed pt having weakness in his knees.  She has also noticed pt nodding off mid day which is very unlike pt's normal behavior, please call pt's mother to discuss.

## 2024-05-22 NOTE — Telephone Encounter (Signed)
 Call to [facility] Dr. Gregg advice that the weakness and drowsiness could be a result of multiple seziure medications and  she is agreeable to continue Xcopri  100 mg.

## 2024-05-22 NOTE — Telephone Encounter (Signed)
 Called to mom, mom reports he is still the 100 mg dose of Xcopri  for about 4 weeks now and  she reports him vomiting Saturday, but saw PCP, no illness or infection. She does feel that his legs are weaker and he is more drowsy during the day. She reports significant reduction in sz from 15-16 monthly, down to 2 a month, most recent last Thursday and last Saturday. She wants to know if the weakness and drowsiness are side effects of the xcopri . He is due to increase the xcopri  in 3 days and would like guidance if she should increase or stay at the 100 mg dose. I advised that I would send to Dr. Gregg but typically side effects make happen sooner than 4 weeks. His body may be trying to fight off something since he was vomiting, and had 2 seizures.

## 2024-05-22 NOTE — Telephone Encounter (Signed)
 This is likely an interaction between Xcopri , Lamotrigine  and Lacosamide . We will keep him at 100 mg for now. I will change the prescription. Thanks

## 2024-05-29 ENCOUNTER — Ambulatory Visit (INDEPENDENT_AMBULATORY_CARE_PROVIDER_SITE_OTHER): Admitting: Podiatry

## 2024-05-29 DIAGNOSIS — L600 Ingrowing nail: Secondary | ICD-10-CM

## 2024-05-29 NOTE — Progress Notes (Unsigned)
 Subjective:  Patient ID: Harry Carr, male    DOB: 1981/01/08,  MRN: [id number]  Harry Carr presents to clinic today for:  Chief Complaint  Patient presents with   Nail Problem    Right 1st. A year ago had nail removed by Dr. Sherre, PCP. Now nail has grown back and is becoming ingrown. Painful to touch, no drainage, some redness, no heat to touch. Has tried soaking the toe with some relief, tried to trim the nail but he does not like for them to be cut. Not diabetic, takes ASA.    Patient presents with his mother today with concern of an ingrown toenail to the right great toe along the medial border.  He is in a wheelchair today.  He has cerebral palsy.  He also has a seizure disorder.  No Known Allergies  Objective:  Harry Carr is a pleasant 43 y.o. male in NAD. AAO x 3.  Vascular Examination: Capillary refill time is 3-5 seconds to toes bilateral. Palpable pedal pulses b/l LE. Digital hair present b/l. No pedal edema b/l. Skin temperature gradient WNL b/l. No varicosities b/l. No cyanosis or clubbing noted b/l.   Dermatological Examination: There is incurvation of the right hallux medial nail border.  There is pain on palpation of the affected nail border.  No clinical signs of paronychia are noted today.  No active drainage is noted.  Neurological Examination: Epicritic sensation is intact to the toes.  Assessment/Plan: 1. Ingrown toenail    Discussed patient's condition today with him and his mother.  After obtaining patient consent, the right hallux was anesthetized with a 50:50 mixture of 1% lidocaine  plain and 0.5% bupivacaine  plain for a total of 3cc's administered.  Upon confirmation of anesthesia, a freer elevator was utilized to free the right hallux medial nail border from the nail bed.    *It should be noted at this point during the procedure, the patient did experience a short seizure.  His mother did assist with holding a calm and the patient while this passed  within 90 seconds.  I did temporarily stop the procedure and just placed pressure on the toe for some hemostasis while this ensued.  Once it was clear the patient was stable, the procedure was completed.  We did offer to obtain final signs for the patient today, but his mother stated it was not necessary and preferred that we not do that.  The nail border was then avulsed proximal to the eponychium and removed in toto.  The area was inspected for any remaining spicules.  A chemical matrixectomy was performed with NaOH and neutralized with acetic acid solution.  Antibiotic ointment and a DSD were applied, followed by a Coban dressing.  Patient tolerated the anesthetic and procedure well and will f/u in 2-3 weeks for recheck.  Patient given post-procedure instructions for daily 15-minute Epsom salt soaks, antibiotic ointment and daily use of Bandaids until toe starts to dry / form eschar.   Return in about 2 weeks (around 06/12/2024) for PNA recheck.   Awanda CHARM Imperial, DPM, FACFAS [facility]     [address], [place] [address]                Office [phone])  [phone]  Fax [phone]

## 2024-05-29 NOTE — Patient Instructions (Signed)

## 2024-06-07 DIAGNOSIS — G808 Other cerebral palsy: Secondary | ICD-10-CM | POA: Diagnosis not present

## 2024-06-12 ENCOUNTER — Ambulatory Visit: Admitting: Podiatry

## 2024-06-12 DIAGNOSIS — L600 Ingrowing nail: Secondary | ICD-10-CM

## 2024-06-12 NOTE — Progress Notes (Signed)
    Subjective:  Patient ID: Harry Carr, male    DOB: June 12, 1981,  MRN: [id number]  Chief Complaint  Patient presents with   PNA Check    PNA Check Right Hallux, medial nail border. Looks great, no pain. Good to go.    Harry Carr presents to clinic today for f/u of PNA to the right hallux medial nail border.  Denies any pain.  Patient was evaluated by the certified medical assistant today.  He was originally scheduled for this appointment tomorrow but accidentally showed up today and was squeezed onto the booked schedule for convenience to the patient.  PCP is Cox, Abigail, MD.  No Known Allergies  Objective:  (Exam was performed by the certified medical assistant)  Dermatological Examination: Upon inspection of the PNA site, there are no clinical signs of infection.  No purulence, no necrosis, no malodor present.  Minimal to no erythema present.  Eschar formed along nail margin.  Minimal to no pain on palpation of area.   Assessment/Plan: 1. Ingrown toenail     Patient instructed to discontinue all postprocedure instructions at this time.  Follow-up as needed   Awanda CHARM Imperial, DPM, FACFAS [facility]     [address] [place], [place] [address]                Office [phone]  Fax [phone]

## 2024-06-13 ENCOUNTER — Ambulatory Visit: Admitting: Podiatry

## 2024-06-20 ENCOUNTER — Other Ambulatory Visit: Payer: Self-pay | Admitting: *Deleted

## 2024-06-20 NOTE — Telephone Encounter (Signed)
 Last seen on 02/29/24 Follow up scheduled on 07/05/24   Dispensed Days Supply Quantity Provider Pharmacy  Vimpat  200 mg tablet 05/31/2024 30 60 each Lomax, Amy, NP [employer]...     Rx pending to be signed

## 2024-06-21 MED ORDER — VIMPAT 200 MG PO TABS: 200.0000 mg | ORAL_TABLET | Freq: Two times a day (BID) | ORAL | 3 refills | Status: AC

## 2024-06-25 ENCOUNTER — Other Ambulatory Visit: Payer: Self-pay

## 2024-06-25 ENCOUNTER — Telehealth: Payer: Self-pay

## 2024-06-25 MED ORDER — NAYZILAM 5 MG/0.1ML NA SOLN: 5.0000 mg | NASAL | 5 refills | Status: AC | PRN

## 2024-06-25 NOTE — Progress Notes (Deleted)
 Refills needed. Please review and sign

## 2024-06-25 NOTE — Telephone Encounter (Signed)
 He was very drowsy, that is why we could not go up on the medication. Please all and ask about drowsiness and any additional side effect from the medications.

## 2024-06-25 NOTE — Telephone Encounter (Signed)
 Mom called stating refill was needed. She also reports that about every 5 days he is having a seizure. He is not missing med doses and is swiping his VNS. Has an appointment 10/31 with Dr Gregg. Mom asking if anything needs to be done in the meantime. Advised I would send to Dr Gregg for review.

## 2024-06-26 NOTE — Telephone Encounter (Signed)
 Addendum to initial note. Mom feels like patient was fighting an infection at the same time as increase of Xcopri  and likely was a contributing factor to the increased drowsiness.

## 2024-06-27 NOTE — Telephone Encounter (Signed)
 Call to mom in sperate note

## 2024-07-05 ENCOUNTER — Encounter: Payer: Self-pay | Admitting: Neurology

## 2024-07-05 ENCOUNTER — Ambulatory Visit: Admitting: Neurology

## 2024-07-05 VITALS — BP 121/79 | HR 69

## 2024-07-05 DIAGNOSIS — G808 Other cerebral palsy: Secondary | ICD-10-CM | POA: Diagnosis not present

## 2024-07-05 DIAGNOSIS — Z4542 Encounter for adjustment and management of neuropacemaker (brain) (peripheral nerve) (spinal cord): Secondary | ICD-10-CM

## 2024-07-05 DIAGNOSIS — G40011 Localization-related (focal) (partial) idiopathic epilepsy and epileptic syndromes with seizures of localized onset, intractable, with status epilepticus: Secondary | ICD-10-CM

## 2024-07-05 DIAGNOSIS — Z9689 Presence of other specified functional implants: Secondary | ICD-10-CM

## 2024-07-05 MED ORDER — CENOBAMATE 150 MG PO TABS: 150.0000 mg | ORAL_TABLET | Freq: Every day | ORAL | 5 refills | Status: DC

## 2024-07-05 NOTE — Patient Instructions (Signed)
 Increase cenobamate  250 mg twice daily Continue other medications including   Lacosamide  200 mg twice daily  Lamotrigine  XR 600 mg daily  Levetiracetam  XR 2250 mg in the morning and 1500 in the evening  His VNS settings were changed today and he tolerated procedure very well Follow-up in 3 months or sooner if worse

## 2024-07-05 NOTE — Progress Notes (Signed)
 [facility]  PATIENT: Harry Carr DOB: [birth year]  REQUESTING CLINICIAN: CoxAbigail, MD HISTORY FROM: Patient/Parent/Chart review  REASON FOR VISIT: Management of intractable epilepsy   HISTORICAL  CHIEF COMPLAINT:  Chief Complaint  Patient presents with   RM12/EPILEPSY    Pt is here with his Mother. Pt's Mother states that pt last Seizure was 06/28/2024 at 7:00pm.    INTERVAL HISTORY 07/05/2024 Patient presents today for follow-up.  He is accompanied by mother.  At last visit in June, we have started him on cenobamate  but he continues to have seizures.  He had 10 seizures in July, 9 August, 7 seizures in September and 6 seizures in October.  He has a major seizure on September 18 requiring Nayzilam .  He does report compliant with his medications, denies any side effect from the medication and no side effect from his VNS.    INTERVAL HISTORY 02/29/2024:  Patient presents today for follow-up, he is accompanied by both parents.  His seizures are still intractable despite Keppra  1500 mg twice daily, Lamotrigine  XR 600 mg daily and Vimpat  200 mg twice daily.  Mother reports on average he has 1 seizure per week.  His seizures are described as tonic posturing, lasting less than a minute.  He also had generalized convulsion but has not had any lately.  He does have a VNS but is subtherapeutic.  They tell me that his seizures that occur in the evening, between 630 PM and 9 PM.   06/20/22 ALL:  Harry Carr is a 43 y.o. male here today for follow up for seizures. He returns with his mother who aids in history. He continues Vimpat  (Brand) 200mg  BID, Keppra  (Brand) 1500mg  BID and lamotrigine  300mg  TB24 QD. He is tolerating meds. Mom added alprazolam  0.25mg  at 7pm. She reports he went 9 days without a seizure after starting. She thought that seizures seemed a little less frequent over the first month but seizures have increased in frequency over the past three months. He  seems to be having at least one event every day, sometimes two. She is swiping magnet at least once daily and then prior t, during and following every seizure event. She reports he will complain of a headache then she notes head turns to one side and eyes are deviated. Event usually last a couple minutes. She uses Nayzilam  if event last longer. She averages 1-2 doses per month. Headache resolves spontaneously following event. No obvious post ictal state. He is followed closely by PCP. He has fasting labs scheduled for tomorrow. He seems to tolerate VNS. No significant sore throat or worsening cough. He continues omeprazole  as needed for GERD. He is taking MVI daily. He is a picky eater.   HISTORY (copied from Dr Dohmeier's previous note)   Darleene Baller presents for VNS interrogation and reprogramming of 4-5 settings. He was seen for a prolonged break through seizure ,which lasted beyond emergency medications. Swiping of the VNS magnet. The ED in [facility] used the term aspiration Pneumonia, but X ray has not confirmed. Gave Diazepam . Wrote for ATB , but not needed.  All seizures at 8 Pm.  His witching hour.    Today we will refill Nayzilam , as a nasal spray and I discussed use of cannabinol.  He is treated for acid reflux.   VNS : We will change the settings_ up on 1.25 mA on VNS,  auto stim to 1.375 mA, Magnet to 1.5 mA. Magnet swipe increase to tid. Pulse width  250  microseconds. On time 30. Off time 3 minutes.    Xanax  as a resume medication, 0.25 mg at 7 Pm to see if this prevents seizures    OTHER MEDICAL CONDITIONS: Epilepsy, cerebral palsy, hypertension  REVIEW OF SYSTEMS: Full 14 system review of systems performed and negative with exception of: As noted in the HPI   ALLERGIES: No Known Allergies  HOME MEDICATIONS: Outpatient Medications Prior to Visit  Medication Sig Dispense Refill   aspirin 81 MG tablet Take 81 mg by mouth daily.     KEPPRA  XR 750 MG 24 hr tablet Take 3  tablets (2,250 mg total) by mouth in the morning and at bedtime. 540 tablet 3   LAMICTAL  XR 300 MG TB24 24 hour tablet Take 2 tablets (600 mg total) by mouth in the morning. 180 tablet 3   losartan  (COZAAR ) 100 MG tablet Take 1 tablet (100 mg total) by mouth daily. for blood pressure 90 tablet 3   Midazolam  (NAYZILAM ) 5 MG/0.1ML SOLN Place 5 mg into the nose as needed (Administer nasal spray at seizure onset. may use an additional nasal spray if the first is ineffective after 10 minutes. Do not exceed more than 2 sprays per seizure episode). 5 each 5   Multiple Vitamins-Minerals (MULTIVITAMIN WITH MINERALS) tablet Take 1 tablet by mouth daily.     omeprazole  (PRILOSEC) 20 MG capsule Take 1 capsule (20 mg total) by mouth 2 (two) times daily before a meal. 180 capsule 3   VIMPAT  200 MG TABS tablet Take 1 tablet (200 mg total) by mouth 2 (two) times daily. 180 tablet 3   vitamin E 45 MG (100 UNITS) capsule Take 500 Units by mouth daily.     XCOPRI  100 MG TABS Take 1 tablet (100 mg total) by mouth daily. 30 tablet 3   No facility-administered medications prior to visit.    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)     PAST SURGICAL HISTORY: Past Surgical History:  Procedure Laterality Date   dorsal rhizotomy  age 66 yrs   VAGUS NERVE STIMULATOR INSERTION Left 07/14/2020   Procedure: VAGAL NERVE STIMULATOR IMPLANT;  Surgeon: Lanis Pupa, MD;  Location: [facility] OR;  Service: Neurosurgery;  Laterality: Left;  anterior   WISDOM TOOTH EXTRACTION  03/2010    FAMILY HISTORY: Family History  Problem Relation Age of Onset   Heart attack Father    Heart disease Maternal Grandmother    Cancer - Other Maternal Grandfather        lukemia    SOCIAL  HISTORY: Social History   Socioeconomic History   Marital status: Single    Spouse name: Not on file   Number of children: 0   Years of education: Not on file   Highest education level: Not on file  Occupational History   Not on file  Tobacco Use   Smoking status: Never   Smokeless tobacco: Never  Substance and Sexual Activity   Alcohol use: Never   Drug use: No   Sexual activity: Not Currently  Other Topics Concern   Not on file  Social History Narrative   Right handed. Caffeine  none.  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   Social  Drivers of Health   Financial Resource Strain: Low Risk  (03/25/2022)   Overall Financial Resource Strain (CARDIA)    Difficulty of Paying Living Expenses: Not hard at all  Food Insecurity: No Food Insecurity (03/25/2022)   Hunger Vital Sign    Worried About Running Out of Food in the Last Year: Never true    Ran Out of Food in the Last Year: Never true  Transportation Needs: No Transportation Needs (03/25/2022)   PRAPARE - Administrator, Civil Service (Medical): No    Lack of Transportation (Non-Medical): No  Physical Activity: Not on file  Stress: No Stress Concern Present (03/25/2022)   [employer] of Occupational Health - Occupational Stress Questionnaire    Feeling of Stress : Not at all  Social Connections: Moderately Isolated (03/25/2022)   Social Connection and Isolation Panel    Frequency of Communication with Friends and Family: More than three times a week    Frequency of Social Gatherings with Friends and Family: More than three times a week    Attends Religious Services: More than 4 times per year    Active Member of [employer] or Organizations: No    Attends Banker Meetings: Never    Marital Status: Never married  Intimate Partner Violence: Not At Risk (03/25/2022)   Humiliation, Afraid, Rape, and Kick questionnaire    Fear of Current or Ex-Partner: No    Emotionally Abused: No     Physically Abused: No    Sexually Abused: No    PHYSICAL EXAM  GENERAL EXAM/CONSTITUTIONAL: Vitals:  Vitals:   07/05/24 1104  BP: 121/79  Pulse: 69  SpO2: 99%   There is no height or weight on file to calculate BMI. Wt Readings from Last 3 Encounters:  02/01/23 165 lb (74.8 kg)  01/26/23 160 lb (72.6 kg)  06/21/22 164 lb 8 oz (74.6 kg)   Patient is in no distress; well developed, nourished and groomed; neck is supple  MUSCULOSKELETAL: Gait, strength, tone, movements noted in Neurologic exam below  NEUROLOGIC: MENTAL STATUS:      No data to display         awake, alert, oriented to person, place and time recent and remote memory intact normal attention and concentration language fluent, comprehension intact, naming intact fund of knowledge appropriate Spastic quadriparesis  Difficulty with fine movement  Gait not tested      DIAGNOSTIC DATA (LABS, IMAGING, TESTING) - I reviewed patient records, labs, notes, testing and imaging myself where available.  Lab Results  Component Value Date   WBC 4.4 01/26/2023   HGB 14.9 01/26/2023   HCT 43.5 01/26/2023   MCV 91 01/26/2023   PLT 243 01/26/2023      Component Value Date/Time   NA 143 01/26/2023 1140   K 4.3 01/26/2023 1140   CL 104 01/26/2023 1140   CO2 24 01/26/2023 1140   GLUCOSE 79 01/26/2023 1140   GLUCOSE 99 07/14/2020 0648   BUN 7 01/26/2023 1140   CREATININE 0.94 01/26/2023 1140   CALCIUM 9.5 01/26/2023 1140   PROT 6.6 01/26/2023 1140   ALBUMIN 4.5 01/26/2023 1140   AST 25 01/26/2023 1140   ALT 17 01/26/2023 1140   ALKPHOS 129 (H) 01/26/2023 1140   BILITOT 0.7 01/26/2023 1140   GFRNONAA 109 08/19/2020 1019   GFRNONAA >60 07/14/2020 0648   GFRAA 126 08/19/2020 1019   Lab Results  Component Value Date   CHOL 194 01/26/2023   HDL 43 01/26/2023  LDLCALC 119 (H) 01/26/2023   TRIG 182 (H) 01/26/2023   No results found for: HGBA1C No results found for: VITAMINB12 No results found  for: TSH  EEG 2021 -Sharp waves, left more than right frontotemporal region    ASSESSMENT AND PLAN  43 y.o. year old male  with history of cerebral palsy, intractable epilepsy status post VNS placement in 2021, hypertension, who is presenting for further management of his intractable epilepsy.  He continues to have seizures, but his frequency is steadily decreasing, going from 10 seizures in July 2, 6 seizures in October.  He is compliant with his medications including levetiracetam  1500 mg twice daily, lacosamide  200 mg twice daily and Lamictal  XR 600 mg daily and cenobamate  100 mg daily.  He also has a VNS, he said he was changed today, he tolerated the procedure very well.  Plan will be to further increase his cenobamate  to 150 mg daily we discussed potential side effects including dizziness, tremor from both lamotrigine  and lacosamide .  If side effects are intolerable, we will likely decrease lacosamide  I will see him in 3 months for follow-up or sooner if worse.  Mother will continue to keep a seizure diary   1. Partial idiopathic epilepsy with seizures of localized onset, intractable, with status epilepticus (HCC)   2. Status post placement of VNS (vagus nerve stimulation) device   3. Other cerebral palsy (HCC)   4. Encounter for fitting and adjustment of vagus nerve stimulator      Patient Instructions  Increase cenobamate  250 mg twice daily Continue other medications including   Lacosamide  200 mg twice daily  Lamotrigine  XR 600 mg daily  Levetiracetam  XR 2250 mg in the morning and 1500 in the evening  His VNS settings were changed today and he tolerated procedure very well Follow-up in 3 months or sooner if worse   Per [place] statutes, patients with seizures are not allowed to drive until they have been seizure-free for six months.  Other recommendations include using caution when using heavy equipment or power tools. Avoid working on ladders or at heights. Take  showers instead of baths.  Do not swim alone.  Ensure the water temperature is not too high on the home water heater. Do not go swimming alone. Do not lock yourself in a room alone (i.e. bathroom). When caring for infants or small children, sit down when holding, feeding, or changing them to minimize risk of injury to the child in the event you have a seizure. Maintain good sleep hygiene. Avoid alcohol.  Also recommend adequate sleep, hydration, good diet and minimize stress.   During the Seizure  - First, ensure adequate ventilation and place patients on the floor on their left side  Loosen clothing around the neck and ensure the airway is patent. If the patient is clenching the teeth, do not force the mouth open with any object as this can cause severe damage - Remove all items from the surrounding that can be hazardous. The patient may be oblivious to what's happening and may not even know what he or she is doing. If the patient is confused and wandering, either gently guide him/her away and block access to outside areas - Reassure the individual and be comforting - Call 911. In most cases, the seizure ends before EMS arrives. However, there are cases when seizures may last over 3 to 5 minutes. Or the individual may have developed breathing difficulties or severe injuries. If a pregnant patient or a person  with diabetes develops a seizure, it is prudent to call an ambulance. - Finally, if the patient does not regain full consciousness, then call EMS. Most patients will remain confused for about 45 to 90 minutes after a seizure, so you must use judgment in calling for help. - Avoid restraints but make sure the patient is in a bed with padded side rails - Place the individual in a lateral position with the neck slightly flexed; this will help the saliva drain from the mouth and prevent the tongue from falling backward - Remove all nearby furniture and other hazards from the area - Provide verbal  assurance as the individual is regaining consciousness - Provide the patient with privacy if possible - Call for help and start treatment as ordered by the caregiver   After the Seizure (Postictal Stage)  After a seizure, most patients experience confusion, fatigue, muscle pain and/or a headache. Thus, one should permit the individual to sleep. For the next few days, reassurance is essential. Being calm and helping reorient the person is also of importance.  Most seizures are painless and end spontaneously. Seizures are not harmful to others but can lead to complications such as stress on the lungs, brain and the heart. Individuals with prior lung problems may develop labored breathing and respiratory distress.    Discussed Patients with epilepsy have a small risk of sudden unexpected death, a condition referred to as sudden unexpected death in epilepsy (SUDEP). SUDEP is defined specifically as the sudden, unexpected, witnessed or unwitnessed, nontraumatic and nondrowning death in patients with epilepsy with or without evidence for a seizure, and excluding documented status epilepticus, in which post mortem examination does not reveal a structural or toxicologic cause for death     No orders of the defined types were placed in this encounter.   Meds ordered this encounter  Medications   Cenobamate  150 MG TABS    Sig: Take 1 tablet (150 mg total) by mouth daily at 12 noon.    Dispense:  30 tablet    Refill:  5    Return in about 13 weeks (around 10/04/2024).  The patient's condition of epilepsy requires frequent monitoring and adjustments in the treatment plan, reflecting the ongoing complexity of care.  This provider is the continuing focal point for all needed services for this condition.   Pastor Falling, MD 07/05/2024, 12:51 PM  [facility] [address], [address] [place], [place] [address] [phone]

## 2024-07-10 ENCOUNTER — Telehealth: Payer: Self-pay | Admitting: Neurology

## 2024-07-10 NOTE — Telephone Encounter (Signed)
 Call to mom, she reports since increase in VNS patient has complained of shortness of breath and then last night complained of pain right below stimulator that lasted about 45 minutes. I explained that when the setting is increased a side effect can be the feeling of taking you breath hoarseness, and cough until the body gets used to the increased setting. Advised that I would send to Dr. Gregg  to review the note about chest pain. It only happened one time last night.

## 2024-07-10 NOTE — Telephone Encounter (Signed)
 Pt's mother reports that since pt's increase in his VNS mother reports an increase in pt's blood pressure and pt mentioning of his chest being in pain last night.  Pt's mother states after 45 mins it eased off, she did not take him to the ED, she would like a call from RN to discuss this.

## 2024-07-10 NOTE — Telephone Encounter (Signed)
 Please advised them if the pain is unbearable, to stop by the office to reduce the settings.

## 2024-07-11 NOTE — Telephone Encounter (Signed)
 Please have them check CBC and CMP since he is taking the new medication Cenobamate . Thank you

## 2024-07-17 ENCOUNTER — Ambulatory Visit: Admitting: Family Medicine

## 2024-07-17 ENCOUNTER — Telehealth: Payer: Self-pay

## 2024-07-17 ENCOUNTER — Encounter: Payer: Self-pay | Admitting: Neurology

## 2024-07-17 ENCOUNTER — Ambulatory Visit (INDEPENDENT_AMBULATORY_CARE_PROVIDER_SITE_OTHER): Payer: Self-pay | Admitting: Neurology

## 2024-07-17 VITALS — BP 140/88

## 2024-07-17 DIAGNOSIS — G40011 Localization-related (focal) (partial) idiopathic epilepsy and epileptic syndromes with seizures of localized onset, intractable, with status epilepticus: Secondary | ICD-10-CM

## 2024-07-17 DIAGNOSIS — Z9689 Presence of other specified functional implants: Secondary | ICD-10-CM

## 2024-07-17 NOTE — Progress Notes (Signed)
 VNS setting changes today due to discomfort. Patient tolerated procedure well.   Dr. Mertice Uffelman

## 2024-07-17 NOTE — Progress Notes (Signed)
 Visit made to adjust VNS settings. Patient tolerated procedure well.

## 2024-07-17 NOTE — Telephone Encounter (Signed)
 Pt's mother has called looking for an update ,  She wants to know if Dr Gregg will move forward in turning down the implant as a result of pt also having shortness of breath.  Call routed to [facility]

## 2024-07-17 NOTE — Telephone Encounter (Signed)
 Mom called to report ongoing chest pain and shortness of breath associated with the increase of VNS on 10/31. Dr. Camar and mom had verbal conversation and phone and agreed to decrease VNS by 0.125 ad pulse width to 250 from 500 if needed. Mom will bring patient by today for setting adjustment

## 2024-07-18 ENCOUNTER — Other Ambulatory Visit (HOSPITAL_BASED_OUTPATIENT_CLINIC_OR_DEPARTMENT_OTHER): Admitting: Radiology

## 2024-07-18 ENCOUNTER — Encounter (HOSPITAL_BASED_OUTPATIENT_CLINIC_OR_DEPARTMENT_OTHER): Payer: Self-pay | Admitting: Family Medicine

## 2024-07-18 ENCOUNTER — Ambulatory Visit (INDEPENDENT_AMBULATORY_CARE_PROVIDER_SITE_OTHER): Admitting: Family Medicine

## 2024-07-18 ENCOUNTER — Ambulatory Visit (HOSPITAL_BASED_OUTPATIENT_CLINIC_OR_DEPARTMENT_OTHER): Payer: Self-pay | Admitting: Family Medicine

## 2024-07-18 ENCOUNTER — Ambulatory Visit (HOSPITAL_BASED_OUTPATIENT_CLINIC_OR_DEPARTMENT_OTHER)
Admission: RE | Admit: 2024-07-18 | Discharge: 2024-07-18 | Disposition: A | Source: Ambulatory Visit | Attending: Family Medicine | Admitting: Family Medicine

## 2024-07-18 ENCOUNTER — Ambulatory Visit: Payer: Self-pay

## 2024-07-18 VITALS — BP 123/79 | HR 61 | Temp 98.2°F | Resp 16

## 2024-07-18 DIAGNOSIS — R9431 Abnormal electrocardiogram [ECG] [EKG]: Secondary | ICD-10-CM | POA: Diagnosis not present

## 2024-07-18 DIAGNOSIS — R0609 Other forms of dyspnea: Secondary | ICD-10-CM | POA: Diagnosis not present

## 2024-07-18 DIAGNOSIS — R059 Cough, unspecified: Secondary | ICD-10-CM

## 2024-07-18 DIAGNOSIS — R0789 Other chest pain: Secondary | ICD-10-CM | POA: Insufficient documentation

## 2024-07-18 DIAGNOSIS — R0602 Shortness of breath: Secondary | ICD-10-CM | POA: Diagnosis not present

## 2024-07-18 NOTE — Assessment & Plan Note (Addendum)
 Cardiac risk isn't especially high, but the history if quite limited.  His EKG with nonspecific changes, but can't definitely rule out a prior insult.  His father's history is somewhat alarming.  Briefly d/w Dr. Bernie who agrees to work him in soon for an Echo, etc.  Extended d/w him and his mother.

## 2024-07-18 NOTE — Progress Notes (Signed)
 Established Patient Office Visit  Subjective   Patient ID: Harry Carr, male    DOB: 28-Mar-1981  Age: 43 y.o. MRN: [id number]  Chief Complaint  Patient presents with   Breathing Problem    Breathing problems    Patient usually sees Dr. Sherre and has had unusual DOE when he helps his mother roll his wheelchair.  Recent report of mild chest pain in previous days as well.  No fevers.  Patient's father did have an MI at the age of 7yo.  No chest pain today.  His CP limits his ability as a historian.  History provided by his very attentive mother.  Cough is chronic and minor.  Recent chest pain not clearly related to meals.  Breathing Problem He complains of cough, difficulty breathing and shortness of breath. There is no wheezing. Associated symptoms include chest pain. Pertinent negatives include no fever, malaise/fatigue, PND or weight loss.    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   Cenobamate  150 MG TABS Take 1 tablet (150 mg total) by mouth daily at 12 noon.   KEPPRA  XR 750 MG 24 hr tablet Take 3 tablets (2,250 mg total) by mouth in the morning and at bedtime.   LAMICTAL  XR 300 MG TB24 24 hour tablet Take 2 tablets (600 mg total) by mouth in the morning.   losartan  (COZAAR ) 100 MG tablet Take 1 tablet (100 mg total) by mouth daily. for blood pressure   Midazolam  (NAYZILAM ) 5 MG/0.1ML SOLN Place 5 mg into the nose as needed (Administer nasal spray at seizure onset. may use an additional nasal spray if the first is ineffective after 10 minutes. Do not exceed more than 2 sprays per seizure episode).   Multiple Vitamins-Minerals (MULTIVITAMIN WITH  MINERALS) tablet Take 1 tablet by mouth daily.   omeprazole  (PRILOSEC) 20 MG capsule Take 1 capsule (20 mg total) by mouth 2 (two) times daily before a meal.   VIMPAT  200 MG TABS tablet Take 1 tablet (200 mg total) by mouth 2 (two) times daily.   vitamin E 45 MG (100 UNITS) capsule Take 500 Units by mouth daily.   No facility-administered encounter medications on file as of 07/18/2024.    Social History   Tobacco Use   Smoking status: Never   Smokeless tobacco: Never  Substance Use Topics   Alcohol use: Never   Drug use: No      Review of Systems  Constitutional:  Negative for diaphoresis, fever, malaise/fatigue and weight loss.  Respiratory:  Positive for cough and shortness of breath. Negative for wheezing.   Cardiovascular:  Positive for chest pain. Negative for palpitations, orthopnea, claudication, leg swelling and PND.      Objective:     BP 123/79 (BP Location: Right Arm, Cuff Size: Normal) Comment (Patient Position): pt. in wheelchair  Pulse 61   Temp 98.2 F (36.8 C) (Oral)   Resp 16   SpO2 98%    Physical Exam Constitutional:      General: He is not in acute distress.    Appearance: Normal appearance.     Comments:  Wheelchair bound.  Nondyspneic.  Comfortable and very pleasant.  HENT:     Head: Normocephalic.     Mouth/Throat:     Pharynx: Oropharynx is clear.  Neck:     Vascular: No carotid bruit.  Cardiovascular:     Rate and Rhythm: Normal rate and regular rhythm.     Pulses: Normal pulses.     Heart sounds: Normal heart sounds.  Pulmonary:     Effort: Pulmonary effort is normal.     Breath sounds: Normal breath sounds.     Comments: Except for subtle rales bilaterally.  No wheezing. Abdominal:     General: Bowel sounds are normal.     Palpations: Abdomen is soft.  Musculoskeletal:     Cervical back: Neck supple. No tenderness.     Right lower leg: No edema.     Left lower leg: No edema.  Neurological:     Mental Status: He is alert.       No results found for any visits on 07/18/24.    The 10-year ASCVD risk score (Arnett DK, et al., 2019) is: 1.8%* (Cholesterol units were assumed)    Assessment & Plan:  Atypical chest pain Assessment & Plan: Cardiac risk isn't especially high, but the history if quite limited.  His EKG with nonspecific changes, but can't definitely rule out a prior insult.  His father's history is somewhat alarming.  Briefly d/w Dr. Bernie who agrees to work him in soon for an Echo, etc.  Extended d/w him and his mother.    Orders: -     EKG 12-Lead -     EKG 12-Lead -     Ambulatory referral to Cardiology  DOE (dyspnea on exertion) -     DG Chest 2 View  Abnormal ECG    Return if symptoms worsen or fail to improve.    REDDING PONCE NORLEEN FALCON., MD

## 2024-07-18 NOTE — Telephone Encounter (Signed)
 FYI Only or Action Required?: FYI only for provider: appointment scheduled on 07/18/24.  Patient was last seen in primary care on 05/20/2024 by Sherre Clapper, MD.  Called Nurse Triage reporting Shortness of Breath (//).  Symptoms began ongoing since approx 07/05/24.  Interventions attempted: Other: VNS level was decreased.  Symptoms are: gradually worsening.  Triage Disposition: See Today in Office  Patient/caregiver understands and will follow disposition?: Yes Answer Assessment - Initial Assessment Questions Pts mother reports pt has VNS for seizures that was increased on 07/05/24 and experienced SOB initially.   Continues to have SOB with exertion. Neurologist turned down VNS on 07/17/24. Last night, pts BP was 150/90 and he had SOB again with exertion.   1. RESPIRATORY STATUS: Describe your breathing? (e.g., wheezing, shortness of breath, unable to speak, severe coughing)      SOB with exertion  2. ONSET: When did this breathing problem begin?      07/05/24  3. PATTERN Does the difficult breathing come and go, or has it been constant since it started?      Intermittent, worse with exertion  4. SEVERITY: How bad is your breathing? (e.g., mild, moderate, severe)      Described as increased effort, denies wheezing  5. RECURRENT SYMPTOM: Have you had difficulty breathing before? If Yes, ask: When was the last time? and What happened that time?      See above  Protocols used: Breathing Difficulty-A-AH Copied from CRM [id number]. Topic: Clinical - Red Word Triage >> Jul 18, 2024  9:26 AM Amy B wrote: Red Word that prompted transfer to Nurse Triage: Shortness of breath and high blood pressure

## 2024-07-19 ENCOUNTER — Ambulatory Visit

## 2024-07-19 ENCOUNTER — Encounter: Payer: Self-pay | Admitting: Cardiology

## 2024-07-19 ENCOUNTER — Ambulatory Visit: Attending: Cardiology | Admitting: Cardiology

## 2024-07-19 VITALS — BP 110/70 | HR 73 | Ht 70.0 in | Wt 165.6 lb

## 2024-07-19 DIAGNOSIS — I10 Essential (primary) hypertension: Secondary | ICD-10-CM | POA: Insufficient documentation

## 2024-07-19 DIAGNOSIS — Z8249 Family history of ischemic heart disease and other diseases of the circulatory system: Secondary | ICD-10-CM | POA: Diagnosis not present

## 2024-07-19 DIAGNOSIS — R0789 Other chest pain: Secondary | ICD-10-CM | POA: Insufficient documentation

## 2024-07-19 DIAGNOSIS — R072 Precordial pain: Secondary | ICD-10-CM | POA: Diagnosis not present

## 2024-07-19 DIAGNOSIS — G808 Other cerebral palsy: Secondary | ICD-10-CM | POA: Insufficient documentation

## 2024-07-19 DIAGNOSIS — Z9689 Presence of other specified functional implants: Secondary | ICD-10-CM | POA: Insufficient documentation

## 2024-07-19 DIAGNOSIS — E782 Mixed hyperlipidemia: Secondary | ICD-10-CM | POA: Diagnosis not present

## 2024-07-19 LAB — ECHOCARDIOGRAM COMPLETE
Area-P 1/2: 2.31 cm2
Height: 70 in
S' Lateral: 2.7 cm
Weight: 2649.6 [oz_av]

## 2024-07-19 MED ORDER — METOPROLOL TARTRATE 25 MG PO TABS: ORAL_TABLET | ORAL | 0 refills | Status: AC

## 2024-07-19 MED ORDER — NITROGLYCERIN 0.4 MG SL SUBL: 0.4000 mg | SUBLINGUAL_TABLET | SUBLINGUAL | 6 refills | Status: AC | PRN

## 2024-07-19 NOTE — Patient Instructions (Addendum)
 Medication Instructions:  Your physician has recommended you make the following change in your medication:   Use nitroglycerin 1 tablet placed under the tongue at the first sign of chest pain or an angina attack. 1 tablet may be used every 5 minutes as needed, for up to 15 minutes. Do not take more than 3 tablets in 15 minutes. If pain persist call 911 or go to the nearest ED.   *If you need a refill on your cardiac medications before your next appointment, please call your pharmacy*   Lab Work: Your physician recommends that you have a Stat troponin, BMP and direct LDL today in the office.  If you have labs (blood work) drawn today and your tests are completely normal, you will receive your results only by: MyChart Message (if you have MyChart) OR A paper copy in the mail If you have any lab test that is abnormal or we need to change your treatment, we will call you to review the results.  Testing/Procedures: Your physician has requested that you have an echocardiogram. Echocardiography is a painless test that uses sound waves to create images of your heart. It provides your doctor with information about the size and shape of your heart and how well your heart's chambers and valves are working. This procedure takes approximately one hour. There are no restrictions for this procedure. Please do NOT wear cologne, perfume, aftershave, or lotions (deodorant is allowed). Please arrive 15 minutes prior to your appointment time.  Please note: We ask at that you not bring children with you during ultrasound (echo/ vascular) testing. Due to room size and safety concerns, children are not allowed in the ultrasound rooms during exams. Our front office staff cannot provide observation of children in our lobby area while testing is being conducted. An adult accompanying a patient to their appointment will only be allowed in the ultrasound room at the discretion of the ultrasound technician under special  circumstances. We apologize for any inconvenience.    Your cardiac CT will be scheduled at one of the below locations:   [facility] [address]  [place], [place] [address]  If scheduled at [facility], please arrive to the Heart and Vascular Center 15 mins early for check-in and test prep.  Please follow these instructions carefully (unless otherwise directed):  An IV will be required for this test and Nitroglycerin will be given.  Hold all erectile dysfunction medications at least 3 days (72 hrs) prior to test. (Ie viagra, cialis, sildenafil, tadalafil, etc)   On the Night Before the Test: Be sure to Drink plenty of water. Do not consume any caffeinated/decaffeinated beverages or chocolate 12 hours prior to your test. Do not take any antihistamines 12 hours prior to your test.  On the Day of the Test: Drink plenty of water until 1 hour prior to the test. Do not eat any food 1 hour prior to test. You may take your regular medications prior to the test.  Take metoprolol (Lopressor) 25 mg two hours prior to test.      After the Test: Drink plenty of water. After receiving IV contrast, you may experience a mild flushed feeling. This is normal. On occasion, you may experience a mild rash up to 24 hours after the test. This is not dangerous. If this occurs, you can take Benadryl 25 mg, Zyrtec, Claritin, or Allegra and increase your fluid intake. (Patients taking Tikosyn should avoid Benadryl, and may take  Zyrtec, Claritin, or Allegra) If you experience trouble breathing, this can be serious. If it is severe call 911 IMMEDIATELY. If it is mild, please call our office.  We will call to schedule your test 2-4 weeks out understanding that some insurance companies will need an authorization prior to the service being performed.   For more information and frequently asked questions, please visit our website :  [email or web address]  For non-scheduling related questions, please contact the cardiac imaging nurse navigator should you have any questions/concerns: Cardiac Imaging Nurse Navigators Direct Office Dial: [phone]   For scheduling needs, including cancellations and rescheduling, please call Brittany, [phone].  Follow-Up: At [facility], you and your health needs are our priority.  As part of our continuing mission to provide you with exceptional heart care, we have created designated Provider Care Teams.  These Care Teams include your primary Cardiologist (physician) and Advanced Practice Providers (APPs -  Physician Assistants and Nurse Practitioners) who all work together to provide you with the care you need, when you need it.  We recommend signing up for the patient portal called MyChart.  Sign up information is provided on this After Visit Summary.  MyChart is used to connect with patients for Virtual Visits (Telemedicine).  Patients are able to view lab/test results, encounter notes, upcoming appointments, etc.  Non-urgent messages can be sent to your provider as well.   To learn more about what you can do with MyChart, go to [email or web address].    Your next appointment:   2 month(s)  The format for your next appointment:   In Person  Provider:   Lamar Fitch, MD   Other Instructions Echocardiogram An echocardiogram is a test that uses sound waves (ultrasound) to produce images of the heart. Images from an echocardiogram can provide important information about: Heart size and shape. The size and thickness and movement of your heart's walls. Heart muscle function and strength. Heart valve function or if you have stenosis. Stenosis is when the heart valves are too narrow. If blood is flowing backward through the heart valves (regurgitation). A tumor or infectious growth around the heart valves. Areas of heart muscle that are not working well  because of poor blood flow or injury from a heart attack. Aneurysm detection. An aneurysm is a weak or damaged part of an artery wall. The wall bulges out from the normal force of blood pumping through the body. Tell a health care provider about: Any allergies you have. All medicines you are taking, including vitamins, herbs, eye drops, creams, and over-the-counter medicines. Any blood disorders you have. Any surgeries you have had. Any medical conditions you have. Whether you are pregnant or may be pregnant. What are the risks? Generally, this is a safe test. However, problems may occur, including an allergic reaction to dye (contrast) that may be used during the test. What happens before the test? No specific preparation is needed. You may eat and drink normally. What happens during the test? You will take off your clothes from the waist up and put on a hospital gown. Electrodes or electrocardiogram (ECG)patches may be placed on your chest. The electrodes or patches are then connected to a device that monitors your heart rate and rhythm. You will lie down on a table for an ultrasound exam. A gel will be applied to your chest to help sound waves pass through your skin. A handheld device, called a transducer, will be pressed against your chest and moved over your heart. The transducer produces  sound waves that travel to your heart and bounce back (or echo back) to the transducer. These sound waves will be captured in real-time and changed into images of your heart that can be viewed on a video monitor. The images will be recorded on a computer and reviewed by your health care provider. You may be asked to change positions or hold your breath for a short time. This makes it easier to get different views or better views of your heart. In some cases, you may receive contrast through an IV in one of your veins. This can improve the quality of the pictures from your heart. The procedure may vary  among health care providers and hospitals.   What can I expect after the test? You may return to your normal, everyday life, including diet, activities, and medicines, unless your health care provider tells you not to do that. Follow these instructions at home: It is up to you to get the results of your test. Ask your health care provider, or the department that is doing the test, when your results will be ready. Keep all follow-up visits. This is important. Summary An echocardiogram is a test that uses sound waves (ultrasound) to produce images of the heart. Images from an echocardiogram can provide important information about the size and shape of your heart, heart muscle function, heart valve function, and other possible heart problems. You do not need to do anything to prepare before this test. You may eat and drink normally. After the echocardiogram is completed, you may return to your normal, everyday life, unless your health care provider tells you not to do that. This information is not intended to replace advice given to you by your health care provider. Make sure you discuss any questions you have with your health care provider. Document Revised: 04/14/2020 Document Reviewed: 04/14/2020 Elsevier Patient Education  2021 Elsevier Inc.   Important Information About Sugar

## 2024-07-19 NOTE — Progress Notes (Signed)
 Cardiology Consultation:    Date:  07/19/2024   ID:  Harry Carr, DOB 05/26/1981, MRN [id number]  PCP:  Sherre Clapper, MD  Cardiologist:  Lamar Fitch, MD   Referring MD: Dottie Norleen PHEBE PONCE, MD   No chief complaint on file. Chest pain  History of Present Illness:    Harry Carr is a 43 y.o. male who is being seen today for the evaluation of chest pain at the request of [place], John F. II, MD. past medical history significant for family history of premature coronary disease, dyslipidemia, cerebral palsy, about 10 days ago he had episode of epigastric pain it happened after eating and for 3 days in 0 he was have suffering from this pain.  There was some shortness of breath associated with this sensation.  He never had anything like this before.  Interestingly that recently he is very go nerve stimulator was increased output.  Since that time no more trouble.  EKG done yesterday showed possibility of inferior wall MI however EKG done today did not confirm that.  Also EKG from 2021 did have similar changes.  He never smoked does have family history of premature coronary artery disease, his father apparently had myocardial infarction when he was in his 62s.  He did have difficulty walking around moving around because of cerebral palsy.  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   Cenobamate  150 MG TABS Take 1 tablet (150 mg total) by mouth daily at 12 noon.   KEPPRA  XR 750 MG 24 hr tablet Take 3 tablets (2,250 mg total) by mouth in the morning and at bedtime.   LAMICTAL  XR 300 MG TB24 24 hour tablet Take 2 tablets (600 mg total) by mouth in the morning.   losartan  (COZAAR ) 100 MG tablet Take 1 tablet (100 mg total) by mouth daily. for blood pressure   Midazolam  (NAYZILAM ) 5 MG/0.1ML SOLN Place 5 mg into the nose as needed (Administer nasal spray at seizure onset. may use an additional nasal spray if the first is ineffective after 10 minutes. Do not exceed more than 2 sprays per seizure episode).   Multiple Vitamins-Minerals (MULTIVITAMIN WITH MINERALS) tablet Take 1 tablet by mouth daily.   omeprazole  (PRILOSEC) 20 MG capsule Take 1 capsule (20 mg total) by mouth 2 (two) times daily before a meal.   VIMPAT  200 MG TABS tablet Take 1 tablet (200 mg total) by mouth 2 (two) times daily.   vitamin E 45 MG (100 UNITS) capsule Take 500 Units by mouth daily.     Allergies:   Patient has no known allergies.   Social History   Socioeconomic History   Marital status: Single    Spouse name: Not on file   Number of children: 0   Years  of education: Not on file   Highest education level: Not on file  Occupational History   Not on file  Tobacco Use   Smoking status: Never   Smokeless tobacco: Never  Substance and Sexual Activity   Alcohol use: Never   Drug use: No   Sexual activity: Not Currently  Other Topics Concern   Not on file  Social History Narrative   Right handed. Caffeine  none.  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   Social Drivers of Corporate Investment Banker Strain: Low Risk  (03/25/2022)   Overall Financial Resource Strain (CARDIA)    Difficulty of Paying Living Expenses: Not hard at all  Food Insecurity: No Food Insecurity (03/25/2022)   Hunger Vital Sign    Worried About Running Out of Food in the  Last Year: Never true    Ran Out of Food in the Last Year: Never true  Transportation Needs: No Transportation Needs (03/25/2022)   PRAPARE - Administrator, Civil Service (Medical): No    Lack of Transportation (Non-Medical): No  Physical Activity: Not on file  Stress: No Stress Concern Present (03/25/2022)   [employer] of Occupational Health - Occupational Stress Questionnaire    Feeling of Stress : Not at all  Social Connections: Moderately Isolated (03/25/2022)   Social Connection and Isolation Panel    Frequency of Communication with Friends and Family: More than three times a week    Frequency of Social Gatherings with Friends and Family: More than three times a week    Attends Religious Services: More than 4 times per year    Active Member of [employer] or Organizations: No    Attends Banker Meetings: Never    Marital Status: Never married     Family History: The patient's family history includes Cancer - Other in his maternal grandfather; Heart attack in his father; Heart disease in his maternal grandmother. ROS:   Please see the history of present illness.    All 14 point review of systems negative except as described per history of present illness.  EKGs/Labs/Other Studies Reviewed:    The following studies were reviewed today:   EKG:       Recent Labs: No results found for requested labs within last 365 days.  Recent Lipid Panel    Component Value Date/Time   CHOL 194 01/26/2023 1140   TRIG 182 (H) 01/26/2023 1140   HDL 43 01/26/2023 1140   CHOLHDL 4.5 01/26/2023 1140   LDLCALC 119 (H) 01/26/2023 1140    Physical Exam:    VS:  BP 110/70   Pulse 73   Ht 5' 10 (1.778 m)   Wt 165 lb 9.6 oz (75.1 kg)   SpO2 97%   BMI 23.76 kg/m     Wt Readings from Last 3 Encounters:  07/19/24 165 lb 9.6 oz (75.1 kg)  02/01/23 165 lb (74.8 kg)  01/26/23 160 lb (72.6 kg)     GEN:  Well nourished, well developed in no acute distress HEENT:  Normal NECK: No JVD; No carotid bruits LYMPHATICS: No lymphadenopathy CARDIAC: RRR, no murmurs, no rubs, no gallops RESPIRATORY:  Clear to auscultation without rales, wheezing or rhonchi  ABDOMEN: Soft, non-tender, non-distended MUSCULOSKELETAL:  No edema; No deformity  SKIN: Warm and dry NEUROLOGIC:  Alert and oriented x 3 PSYCHIATRIC:  Normal affect   ASSESSMENT:    1. Atypical chest pain   2. Precordial pain   3. Mixed hyperlipidemia  4. Status post placement of VNS (vagus nerve stimulation) device   5. Hemiplegic cerebral palsy (HCC)   6. Essential hypertension, benign   7. Family history of premature coronary artery disease    PLAN:    In order of problems listed above:  Atypical chest pain.  Quite difficult situation because of his cerebral palsy.  Pain is somewhat concerning on top of that EKG showing possibility of inferior wall myocardial infarction.  I will ask him to have troponin I done stat today, we will schedule him to have echocardiogram to look for any segmental wall motion abnormality.  Give him prescription for nitroglycerin to take it on as-needed basis.  He will be scheduled to have a coronary CT angio hopefully will be able to accomplish the test. Mixed dyslipidemia will wait for results of repeated fasting lipid profile as well as coronary CT angio to determine if anything need to be done. Hemiplegic cerebral palsy.  Noted. Essential hypertension blood pressure well-controlled   Medication Adjustments/Labs and Tests Ordered: Current medicines are reviewed at length with the patient today.  Concerns regarding medicines are outlined above.  Orders Placed This Encounter  Procedures   EKG 12-Lead   No orders of the defined types were placed in this encounter.   Signed, Lamar DOROTHA Fitch, MD, [facility]. 07/19/2024 2:21 PM    [facility]

## 2024-07-20 LAB — BASIC METABOLIC PANEL WITH GFR
BUN/Creatinine Ratio: 12 (ref 9–20)
BUN: 10 mg/dL (ref 6–24)
CO2: 26 mmol/L (ref 20–29)
Calcium: 9.8 mg/dL (ref 8.7–10.2)
Chloride: 99 mmol/L (ref 96–106)
Creatinine, Ser: 0.84 mg/dL (ref 0.76–1.27)
Glucose: 76 mg/dL (ref 70–99)
Potassium: 4.1 mmol/L (ref 3.5–5.2)
Sodium: 141 mmol/L (ref 134–144)
eGFR: 112 mL/min/1.73 (ref >59)

## 2024-07-20 LAB — TROPONIN T: Troponin T (Highly Sensitive): 7 ng/L (ref 0–22)

## 2024-07-20 LAB — LDL CHOLESTEROL, DIRECT: LDL Direct: 124 mg/dL — ABNORMAL HIGH (ref 0–99)

## 2024-07-24 ENCOUNTER — Ambulatory Visit: Payer: Self-pay | Admitting: Cardiology

## 2024-07-31 ENCOUNTER — Telehealth (HOSPITAL_COMMUNITY): Payer: Self-pay | Admitting: Emergency Medicine

## 2024-07-31 NOTE — Telephone Encounter (Signed)
 Reaching out to patient to offer assistance regarding upcoming cardiac imaging study; pt verbalizes understanding of appt date/time, parking situation and where to check in, pre-test NPO status and medications ordered, and verified current allergies; name and call back number provided for further questions should they arise Rockwell Alexandria RN Navigator Cardiac Imaging [facility] [phone] office [phone] cell

## 2024-08-02 ENCOUNTER — Ambulatory Visit (HOSPITAL_COMMUNITY)
Admission: RE | Admit: 2024-08-02 | Discharge: 2024-08-02 | Disposition: A | Discharge status: Home / Self Care | Source: Ambulatory Visit | Attending: Cardiology | Admitting: Cardiology

## 2024-08-02 DIAGNOSIS — R072 Precordial pain: Secondary | ICD-10-CM | POA: Diagnosis not present

## 2024-08-02 DIAGNOSIS — R079 Chest pain, unspecified: Secondary | ICD-10-CM | POA: Diagnosis not present

## 2024-08-02 MED ORDER — IOHEXOL 350 MG/ML SOLN
100.0000 mL | Freq: Once | INTRAVENOUS | Status: AC | PRN
  Administered 2024-08-02: 100 mL via INTRAVENOUS

## 2024-08-02 MED ORDER — NITROGLYCERIN 0.4 MG SL SUBL
0.8000 mg | SUBLINGUAL_TABLET | Freq: Once | SUBLINGUAL | Status: AC
  Administered 2024-08-02: 0.8 mg via SUBLINGUAL

## 2024-08-20 NOTE — Telephone Encounter (Signed)
 Mother is calling about the message from mychart. Please advise

## 2024-08-20 NOTE — Telephone Encounter (Signed)
 Results reviewed with Slater per DPR as per Dr. Karry note. Slater verbalized understanding and had no additional questions. Routed to PCP

## 2024-09-20 ENCOUNTER — Ambulatory Visit: Attending: Cardiology | Admitting: Cardiology

## 2024-09-20 ENCOUNTER — Encounter: Payer: Self-pay | Admitting: Cardiology

## 2024-09-20 VITALS — BP 120/70 | HR 68 | Ht 70.0 in | Wt 165.0 lb

## 2024-09-20 DIAGNOSIS — K219 Gastro-esophageal reflux disease without esophagitis: Secondary | ICD-10-CM | POA: Insufficient documentation

## 2024-09-20 DIAGNOSIS — Z9689 Presence of other specified functional implants: Secondary | ICD-10-CM | POA: Insufficient documentation

## 2024-09-20 DIAGNOSIS — G808 Other cerebral palsy: Secondary | ICD-10-CM | POA: Insufficient documentation

## 2024-09-20 DIAGNOSIS — I10 Essential (primary) hypertension: Secondary | ICD-10-CM | POA: Insufficient documentation

## 2024-09-20 NOTE — Patient Instructions (Signed)
 Medication Instructions:  Your physician recommends that you continue on your current medications as directed. Please refer to the Current Medication list given to you today.  *If you need a refill on your cardiac medications before your next appointment, please call your pharmacy*  Lab Work: NONE If you have labs (blood work) drawn today and your tests are completely normal, you will receive your results only by: MyChart Message (if you have MyChart) OR A paper copy in the mail If you have any lab test that is abnormal or we need to change your treatment, we will call you to review the results.  Testing/Procedures: NONE  Follow-Up: At [facility], you and your health needs are our priority.  As part of our continuing mission to provide you with exceptional heart care, our providers are all part of one team.  This team includes your primary Cardiologist (physician) and Advanced Practice Providers or APPs (Physician Assistants and Nurse Practitioners) who all work together to provide you with the care you need, when you need it.  Your next appointment:  Call or return to clinic prn if these symptoms worsen or fail to improve as anticipated.     Provider:   Lamar Fitch, MD    We recommend signing up for the patient portal called MyChart.  Sign up information is provided on this After Visit Summary.  MyChart is used to connect with patients for Virtual Visits (Telemedicine).  Patients are able to view lab/test results, encounter notes, upcoming appointments, etc.  Non-urgent messages can be sent to your provider as well.   To learn more about what you can do with MyChart, go to [email or web address].   Other Instructions

## 2024-09-20 NOTE — Progress Notes (Unsigned)
 " Cardiology Office Note:    Date:  09/20/2024   ID:  Harry Carr, DOB March 19, 1981, MRN [id number]  PCP:  Sherre Clapper, MD  Cardiologist:  Lamar Fitch, MD    Referring MD: Sherre Clapper, MD   Chief Complaint  Patient presents with   Follow-up  Doing fine  History of Present Illness:    Harry Carr is a 44 y.o. male past medical history significant for cerebral palsy, seizures, he was referred to us  because of atypical chest pain.  Cardiac evaluation included coronary CT angio which showed calcium score 0 no evidence of coronary artery disease there was normal.  Echocardiogram was somewhat suboptimal quality but showed preserved left ventricular ejection fraction.  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   Social History   Socioeconomic History   Marital status: Single    Spouse name: Not on file   Number of children: 0   Years of education: Not on file   Highest education level: Not on file  Occupational History   Not on file  Tobacco Use   Smoking status: Never   Smokeless tobacco: Never  Substance and Sexual Activity   Alcohol use: Never   Drug use: No   Sexual activity: Not Currently  Other Topics Concern   Not on file  Social History Narrative   Right handed. Caffeine  none.  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    Social Drivers of Health   Tobacco Use: Low Risk (09/20/2024)   Patient History    Smoking Tobacco Use: Never    Smokeless Tobacco Use: Never    Passive Exposure: Not on file  Financial Resource Strain: Low Risk (03/25/2022)   Overall Financial Resource Strain (CARDIA)    Difficulty of Paying Living Expenses: Not hard at all  Food Insecurity: No Food Insecurity (03/25/2022)   Hunger Vital Sign    Worried About Running Out of Food in the Last Year: Never true    Ran Out of Food in the Last Year: Never true  Transportation Needs: No Transportation Needs (03/25/2022)   PRAPARE - Administrator, Civil Service (Medical): No    Lack of Transportation (Non-Medical): No  Physical Activity: Not on file  Stress: No Stress Concern Present (03/25/2022)   [employer] of Occupational Health - Occupational Stress Questionnaire    Feeling of Stress : Not at all  Social Connections: Moderately Isolated (03/25/2022)   Social Connection and Isolation Panel    Frequency of Communication with Friends and Family: More than three times a week    Frequency of Social Gatherings with Friends and Family: More than three times a week    Attends Religious Services: More than 4 times per year    Active Member of Clubs or Organizations: No  Attends Banker Meetings: Never    Marital Status: Never married  Depression (PHQ2-9): Low Risk (05/20/2024)   Depression (PHQ2-9)    PHQ-2 Score: 0  Alcohol Screen: Low Risk (03/25/2022)   Alcohol Screen    Last Alcohol Screening Score (AUDIT): 0  Housing: Unknown (10/04/2023)   Received from [facility]   Epic    Unable to Pay for Housing in the Last Year: Not on file    Number of Times Moved in the Last Year: Not on file    At any time in the past 12 months, were you homeless or living in a shelter (including now)?: No  Utilities: Not At Risk (05/20/2024)   Epic    Threatened with loss of utilities: No  Health Literacy:  Not on file     Family History: The patient's family history includes Cancer - Other in his maternal grandfather; Heart attack in his father; Heart disease in his maternal grandmother. ROS:   Please see the history of present illness.    All 14 point review of systems negative except as described per history of present illness  EKGs/Labs/Other Studies Reviewed:         Recent Labs: 07/19/2024: BUN 10; Creatinine, Ser 0.84; Potassium 4.1; Sodium 141  Recent Lipid Panel    Component Value Date/Time   CHOL 194 01/26/2023 1140   TRIG 182 (H) 01/26/2023 1140   HDL 43 01/26/2023 1140   CHOLHDL 4.5 01/26/2023 1140   LDLCALC 119 (H) 01/26/2023 1140   LDLDIRECT 124 (H) 07/19/2024 1500    Physical Exam:    VS:  BP 120/70   Pulse 68   Ht 5' 10 (1.778 m)   Wt 165 lb (74.8 kg)   SpO2 96%   BMI 23.68 kg/m     Wt Readings from Last 3 Encounters:  09/20/24 165 lb (74.8 kg)  07/19/24 165 lb 9.6 oz (75.1 kg)  02/01/23 165 lb (74.8 kg)     GEN:  Well nourished, well developed in no acute distress HEENT: Normal NECK: No JVD; No carotid bruits LYMPHATICS: No lymphadenopathy CARDIAC: RRR, no murmurs, no rubs, no gallops RESPIRATORY:  Clear to auscultation without rales, wheezing or rhonchi  ABDOMEN: Soft, non-tender, non-distended MUSCULOSKELETAL:  No edema; No deformity  SKIN: Warm and dry LOWER EXTREMITIES: no swelling NEUROLOGIC:  Alert and oriented x 3 PSYCHIATRIC:  Normal affect   ASSESSMENT:    1. Essential hypertension, benign   2. Gastroesophageal reflux disease without esophagitis   3. Hemiplegic cerebral palsy (HCC)   4. Status post placement of VNS (vagus nerve stimulation) device    PLAN:    In order of problems listed above:  Chest pain atypical test negative no evidence of coronary artery disease normal calcium score 0 will drop the issue of coronary disease.  He will see me on as-needed basis in the future. Dyslipidemia LDL 124 HDL 39 now with calcium  score of 0 diet and exercise is recommended Cerebral palsy, noted, is follow-up and taking care of extremity but is moderate.   Medication Adjustments/Labs and Tests Ordered: Current medicines are reviewed at length with the patient today.  Concerns regarding medicines are outlined above.  No orders of the defined types were placed in this encounter.  Medication changes: No orders of the defined types were placed in this encounter.   Signed, Lamar DOROTHA Fitch, MD, [facility] 09/20/2024 2:08 PM    [facility]    [1]  Current Meds  Medication Sig   aspirin 81 MG tablet Take 81 mg by mouth daily.   Cenobamate  150 MG TABS Take 1 tablet (150 mg total) by mouth daily at 12 noon.   LAMICTAL  XR 300 MG TB24 24 hour tablet Take 2 tablets (600 mg total) by mouth in the morning.   levETIRAcetam  (KEPPRA ) 750 MG tablet Take 750 mg by mouth as directed. Take 3 in the am and 2 at night   losartan  (COZAAR ) 100 MG tablet Take 1 tablet (100 mg total) by mouth daily. for blood pressure   metoprolol  tartrate (LOPRESSOR ) 25 MG tablet Take 25 mg Lopressor  2 hours prior to your CT scan for a heart rate greater than 55.   Midazolam  (NAYZILAM ) 5 MG/0.1ML SOLN Place 5 mg into the nose as needed (Administer nasal spray at seizure onset. may use an additional nasal spray if the first is ineffective after 10 minutes. Do not exceed more than 2 sprays per seizure episode).   Multiple Vitamins-Minerals (MULTIVITAMIN WITH MINERALS) tablet Take 1 tablet by mouth daily.   nitroGLYCERIN  (NITROSTAT ) 0.4 MG SL tablet Place 1 tablet (0.4 mg total) under the tongue every 5 (five) minutes as needed.   omeprazole  (PRILOSEC) 20 MG capsule Take 1 capsule (20 mg total) by mouth 2 (two) times daily before a meal.   VIMPAT  200 MG TABS tablet Take 1 tablet (200 mg total) by mouth 2 (two) times daily.   vitamin E 45 MG (100 UNITS) capsule Take 500 Units by mouth daily.   [DISCONTINUED] KEPPRA  XR 750 MG 24 hr tablet  Take 3 tablets (2,250 mg total) by mouth in the morning and at bedtime.   "

## 2024-10-03 ENCOUNTER — Telehealth: Payer: Self-pay | Admitting: *Deleted

## 2024-10-03 NOTE — Telephone Encounter (Addendum)
 Mother calling about pt trying to go into seziure and wondering whether to give him the midazolam  .  She said that she felt like he make be coming down with something, felt warm, she has illness last week.  Today he told her that he felt he was trying to have a seizures.  She would note the jerking of his head every 3-4 minutes for the last  35-40 minutes. After consulting Dr. Gregg  he said that she could give him the midazolam  and it would make him sleep, but may break the cycle.  She verbalized understanding.  Appreciated the call.

## 2024-10-04 ENCOUNTER — Other Ambulatory Visit: Payer: Self-pay | Admitting: Neurology

## 2024-10-04 MED ORDER — CLONAZEPAM 1 MG PO TABS: 1.0000 mg | ORAL_TABLET | Freq: Two times a day (BID) | ORAL | 0 refills | Status: AC

## 2024-10-04 NOTE — Telephone Encounter (Signed)
 Pt's mother called stating that the pt is having spasms and tightening of the right leg. She would like for the RN to call her back to discuss.

## 2024-10-04 NOTE — Telephone Encounter (Signed)
 Spoke with mother, will give him a 5 days course of Clonazepam  1 mg twice daily.

## 2024-10-08 ENCOUNTER — Ambulatory Visit: Admitting: Neurology

## 2024-10-08 ENCOUNTER — Encounter: Payer: Self-pay | Admitting: Neurology

## 2024-10-08 VITALS — BP 112/71 | HR 64 | Ht 70.0 in

## 2024-10-08 DIAGNOSIS — Z9689 Presence of other specified functional implants: Secondary | ICD-10-CM | POA: Diagnosis not present

## 2024-10-08 DIAGNOSIS — G808 Other cerebral palsy: Secondary | ICD-10-CM | POA: Diagnosis not present

## 2024-10-08 DIAGNOSIS — G249 Dystonia, unspecified: Secondary | ICD-10-CM

## 2024-10-08 DIAGNOSIS — G40011 Localization-related (focal) (partial) idiopathic epilepsy and epileptic syndromes with seizures of localized onset, intractable, with status epilepticus: Secondary | ICD-10-CM | POA: Diagnosis not present

## 2024-10-08 MED ORDER — CENOBAMATE 200 MG PO TABS: 200.0000 mg | ORAL_TABLET | Freq: Every day | ORAL | 5 refills | Status: AC

## 2024-10-08 NOTE — Patient Instructions (Addendum)
 Increase Cenobamate  to 200 mg daily  Continue with lacosamide  200 mg twice daily Continue with lamotrigine  XR 600 mg daily Continue with levetiracetam  2250 in the morning and 1500 at night Continue your other medications If any side effects, we will likely decrease lacosamide  VNS settings changed today, patient tolerated procedure very well Follow-up in 2 to 3 months

## 2024-10-08 NOTE — Procedures (Signed)
 SABRA

## 2024-12-18 ENCOUNTER — Ambulatory Visit: Admitting: Neurology

## 2024-12-31 ENCOUNTER — Ambulatory Visit: Admitting: Neurology
